# Patient Record
Sex: Female | Born: 1975 | Race: White | Hispanic: No | Smoking: Never smoker
Health system: Southern US, Community
[De-identification: ages and names within clinical notes are randomized; demographics above are authoritative.]

## PROBLEM LIST (undated history)

## (undated) DIAGNOSIS — I10 Essential (primary) hypertension: Secondary | ICD-10-CM

## (undated) DIAGNOSIS — D069 Carcinoma in situ of cervix, unspecified: Secondary | ICD-10-CM

## (undated) HISTORY — DX: Essential (primary) hypertension: I10

## (undated) HISTORY — DX: Carcinoma in situ of cervix, unspecified: D06.9

---

## 2001-08-02 ENCOUNTER — Other Ambulatory Visit: Admission: RE | Admit: 2001-08-02 | Discharge: 2001-08-02 | Payer: Self-pay | Admitting: Gynecology

## 2002-09-10 ENCOUNTER — Other Ambulatory Visit: Admission: RE | Admit: 2002-09-10 | Discharge: 2002-09-10 | Payer: Self-pay | Admitting: Gynecology

## 2003-10-03 ENCOUNTER — Other Ambulatory Visit: Admission: RE | Admit: 2003-10-03 | Discharge: 2003-10-03 | Payer: Self-pay | Admitting: Internal Medicine

## 2004-10-14 ENCOUNTER — Other Ambulatory Visit: Admission: RE | Admit: 2004-10-14 | Discharge: 2004-10-14 | Payer: Self-pay | Admitting: Gynecology

## 2005-05-03 ENCOUNTER — Inpatient Hospital Stay (HOSPITAL_COMMUNITY): Admission: AD | Admit: 2005-05-03 | Discharge: 2005-05-06 | Payer: Self-pay | Admitting: Gynecology

## 2005-06-15 ENCOUNTER — Encounter: Payer: Self-pay | Admitting: Family Medicine

## 2005-06-15 LAB — CONVERTED CEMR LAB

## 2005-06-16 ENCOUNTER — Other Ambulatory Visit: Admission: RE | Admit: 2005-06-16 | Discharge: 2005-06-16 | Payer: Self-pay | Admitting: Gynecology

## 2006-01-20 ENCOUNTER — Ambulatory Visit: Payer: Self-pay | Admitting: Family Medicine

## 2006-04-28 ENCOUNTER — Encounter: Payer: Self-pay | Admitting: Family Medicine

## 2006-05-18 ENCOUNTER — Ambulatory Visit: Payer: Self-pay | Admitting: Family Medicine

## 2006-05-18 DIAGNOSIS — G44209 Tension-type headache, unspecified, not intractable: Secondary | ICD-10-CM

## 2006-06-23 ENCOUNTER — Other Ambulatory Visit: Admission: RE | Admit: 2006-06-23 | Discharge: 2006-06-23 | Payer: Self-pay | Admitting: Gynecology

## 2007-04-11 ENCOUNTER — Ambulatory Visit: Payer: Self-pay | Admitting: Family Medicine

## 2007-06-02 ENCOUNTER — Ambulatory Visit: Payer: Self-pay | Admitting: Family Medicine

## 2007-06-02 DIAGNOSIS — J029 Acute pharyngitis, unspecified: Secondary | ICD-10-CM

## 2007-06-02 LAB — CONVERTED CEMR LAB: Rapid Strep: NEGATIVE

## 2007-06-03 ENCOUNTER — Encounter: Payer: Self-pay | Admitting: Family Medicine

## 2007-07-06 ENCOUNTER — Other Ambulatory Visit: Admission: RE | Admit: 2007-07-06 | Discharge: 2007-07-06 | Payer: Self-pay | Admitting: Gynecology

## 2007-09-08 ENCOUNTER — Ambulatory Visit: Payer: Self-pay | Admitting: Family Medicine

## 2007-09-08 DIAGNOSIS — M659 Synovitis and tenosynovitis, unspecified: Secondary | ICD-10-CM

## 2008-01-15 ENCOUNTER — Ambulatory Visit: Payer: Self-pay | Admitting: Family Medicine

## 2008-01-15 DIAGNOSIS — M545 Low back pain, unspecified: Secondary | ICD-10-CM | POA: Insufficient documentation

## 2008-01-22 ENCOUNTER — Encounter: Payer: Self-pay | Admitting: Family Medicine

## 2008-01-29 ENCOUNTER — Encounter: Payer: Self-pay | Admitting: Family Medicine

## 2008-07-04 ENCOUNTER — Encounter: Payer: Self-pay | Admitting: Family Medicine

## 2008-07-04 LAB — CONVERTED CEMR LAB
Cholesterol: 171 mg/dL
LDL Cholesterol: 62 mg/dL

## 2008-07-08 ENCOUNTER — Ambulatory Visit: Payer: Self-pay | Admitting: Women's Health

## 2008-07-08 ENCOUNTER — Encounter: Payer: Self-pay | Admitting: Women's Health

## 2008-07-08 ENCOUNTER — Other Ambulatory Visit: Admission: RE | Admit: 2008-07-08 | Discharge: 2008-07-08 | Payer: Self-pay | Admitting: Gynecology

## 2008-07-08 ENCOUNTER — Encounter: Payer: Self-pay | Admitting: Family Medicine

## 2008-07-08 LAB — CONVERTED CEMR LAB
HCT: 44.6 %
Platelets: 293 10*3/uL

## 2008-07-10 ENCOUNTER — Encounter: Payer: Self-pay | Admitting: Family Medicine

## 2008-07-10 DIAGNOSIS — E785 Hyperlipidemia, unspecified: Secondary | ICD-10-CM

## 2008-07-15 ENCOUNTER — Ambulatory Visit: Payer: Self-pay | Admitting: Family Medicine

## 2008-10-08 ENCOUNTER — Telehealth: Payer: Self-pay | Admitting: Family Medicine

## 2008-10-08 ENCOUNTER — Telehealth (INDEPENDENT_AMBULATORY_CARE_PROVIDER_SITE_OTHER): Payer: Self-pay | Admitting: *Deleted

## 2008-10-10 ENCOUNTER — Encounter: Payer: Self-pay | Admitting: Family Medicine

## 2008-10-11 LAB — CONVERTED CEMR LAB
ALT: 15 units/L (ref 0–35)
AST: 13 units/L (ref 0–37)
Albumin: 4.6 g/dL (ref 3.5–5.2)
Alkaline Phosphatase: 48 units/L (ref 39–117)
BUN: 12 mg/dL (ref 6–23)
CO2: 24 meq/L (ref 19–32)
Calcium: 9 mg/dL (ref 8.4–10.5)
Chloride: 106 meq/L (ref 96–112)
Cholesterol: 163 mg/dL (ref 0–200)
Creatinine, Ser: 0.6 mg/dL (ref 0.40–1.20)
Glucose, Bld: 76 mg/dL (ref 70–99)
HDL: 42 mg/dL (ref >39)
LDL Cholesterol: 93 mg/dL (ref 0–99)
Potassium: 3.9 meq/L (ref 3.5–5.3)
Sodium: 142 meq/L (ref 135–145)
Total Bilirubin: 1 mg/dL (ref 0.3–1.2)
Total CHOL/HDL Ratio: 3.9
Total Protein: 7.1 g/dL (ref 6.0–8.3)
Triglycerides: 139 mg/dL (ref <150)
VLDL: 28 mg/dL (ref 0–40)

## 2008-10-14 ENCOUNTER — Ambulatory Visit: Payer: Self-pay | Admitting: Family Medicine

## 2009-05-26 ENCOUNTER — Ambulatory Visit: Payer: Self-pay | Admitting: Family Medicine

## 2009-05-26 DIAGNOSIS — J020 Streptococcal pharyngitis: Secondary | ICD-10-CM

## 2009-05-26 LAB — CONVERTED CEMR LAB: Rapid Strep: POSITIVE

## 2009-06-14 DIAGNOSIS — D069 Carcinoma in situ of cervix, unspecified: Secondary | ICD-10-CM

## 2009-06-14 HISTORY — DX: Carcinoma in situ of cervix, unspecified: D06.9

## 2009-07-09 ENCOUNTER — Other Ambulatory Visit: Admission: RE | Admit: 2009-07-09 | Discharge: 2009-07-09 | Payer: Self-pay | Admitting: Gynecology

## 2009-07-09 ENCOUNTER — Ambulatory Visit: Payer: Self-pay | Admitting: Women's Health

## 2009-07-15 HISTORY — PX: CERVICAL BIOPSY  W/ LOOP ELECTRODE EXCISION: SUR135

## 2009-07-18 ENCOUNTER — Ambulatory Visit: Payer: Self-pay | Admitting: Gynecology

## 2009-07-25 ENCOUNTER — Ambulatory Visit: Payer: Self-pay | Admitting: Gynecology

## 2009-08-04 ENCOUNTER — Ambulatory Visit: Payer: Self-pay | Admitting: Gynecology

## 2009-08-04 ENCOUNTER — Ambulatory Visit (HOSPITAL_BASED_OUTPATIENT_CLINIC_OR_DEPARTMENT_OTHER): Admission: RE | Admit: 2009-08-04 | Discharge: 2009-08-04 | Payer: Self-pay | Admitting: Gynecology

## 2009-08-18 ENCOUNTER — Ambulatory Visit: Payer: Self-pay | Admitting: Gynecology

## 2009-08-22 ENCOUNTER — Ambulatory Visit: Payer: Self-pay | Admitting: Family Medicine

## 2009-08-22 DIAGNOSIS — R05 Cough: Secondary | ICD-10-CM

## 2009-08-22 DIAGNOSIS — J309 Allergic rhinitis, unspecified: Secondary | ICD-10-CM | POA: Insufficient documentation

## 2009-08-25 ENCOUNTER — Telehealth: Payer: Self-pay | Admitting: Family Medicine

## 2009-10-20 ENCOUNTER — Ambulatory Visit: Payer: Self-pay | Admitting: Family Medicine

## 2009-10-20 DIAGNOSIS — J0110 Acute frontal sinusitis, unspecified: Secondary | ICD-10-CM

## 2009-10-20 DIAGNOSIS — R599 Enlarged lymph nodes, unspecified: Secondary | ICD-10-CM | POA: Insufficient documentation

## 2009-12-22 ENCOUNTER — Other Ambulatory Visit: Admission: RE | Admit: 2009-12-22 | Discharge: 2009-12-22 | Payer: Self-pay | Admitting: Gynecology

## 2009-12-22 ENCOUNTER — Ambulatory Visit: Payer: Self-pay | Admitting: Gynecology

## 2010-02-27 ENCOUNTER — Telehealth (INDEPENDENT_AMBULATORY_CARE_PROVIDER_SITE_OTHER): Payer: Self-pay | Admitting: *Deleted

## 2010-07-09 ENCOUNTER — Other Ambulatory Visit (HOSPITAL_COMMUNITY)
Admission: RE | Admit: 2010-07-09 | Discharge: 2010-07-09 | Disposition: A | Discharge status: Home / Self Care | Payer: Managed Care, Other (non HMO) | Source: Ambulatory Visit | Attending: Gynecology | Admitting: Gynecology

## 2010-07-09 ENCOUNTER — Other Ambulatory Visit: Payer: Self-pay | Admitting: Women's Health

## 2010-07-09 ENCOUNTER — Ambulatory Visit
Admission: RE | Admit: 2010-07-09 | Discharge: 2010-07-09 | Payer: Self-pay | Source: Home / Self Care | Attending: Women's Health | Admitting: Women's Health

## 2010-07-09 DIAGNOSIS — Z124 Encounter for screening for malignant neoplasm of cervix: Secondary | ICD-10-CM | POA: Insufficient documentation

## 2010-07-12 ENCOUNTER — Encounter: Payer: Self-pay | Admitting: Family Medicine

## 2010-07-14 ENCOUNTER — Telehealth: Payer: Self-pay | Admitting: Family Medicine

## 2010-07-14 NOTE — Progress Notes (Signed)
Summary: FLU SHOT     Influenza Immunization History:    Influenza # 1:  Historical (02/26/2010)

## 2010-07-14 NOTE — Assessment & Plan Note (Signed)
Summary: allergies   Vital Signs:  Patient profile:   35 year old female Height:      65 inches Weight:      194 pounds BMI:     32.40 O2 Sat:      100 % on Room air Temp:     98.6 degrees F oral Pulse rate:   88 / minute BP sitting:   107 / 66  (left arm) Cuff size:   large  Vitals Entered By: Payton Spark CMA (August 22, 2009 1:14 PM)  O2 Flow:  Room air CC: Cough x 2 weeks. ? Allergies   Primary Care Provider:  Seymour Bars DO  CC:  Cough x 2 weeks. ? Allergies.  History of Present Illness: 35  yr WF presents with a dry cough x2 weeks.. Her cough is worsened by talking and cold air.  She also complains of sneezing, HA,and  itchy watery eyes.  No rhinorrhea. Denies sore throat or hoarsness.  No SOB or chest tightness.  She tried Claritin last week which helped with associated symptoms but did not improve her cough.   Denies any history of (seasonal) allergies or asthma.   No Known drug allergies.  She has one cat in her house. Non-smoker.   She had a LEEP procedure done in the GYN office recently.   Current Medications (verified): 1)  Fish Oil 1000 Mg Caps (Omega-3 Fatty Acids) .... By Mouth Daily 2)  Junel 1.5/30 1.5-30 Mg-Mcg Tabs (Norethindrone Acet-Ethinyl Est) .... Take 1 Tab By Mouth Once Daily  Allergies (verified): No Known Drug Allergies  Past History:  Past Medical History: Reviewed history from 10/14/2008 and no changes required. Gyn in [place], pap smear Jan 2010 --normal.    Past Surgical History: LTCS Leep 2011  Family History: Reviewed history from 05/18/2006 and no changes required. father died in an accident  mother healthy, only child  Social History: Reviewed history from 10/14/2008 and no changes required. 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  Married to [place].  Has a 61 yo son. and 2 stepchildren.  Nonsmoker.  2 ETOH drinks per wk.  On OCPs.  Does not exercise.  Review of Systems      See HPI  Physical Exam  General:  alert,  well-developed, well-nourished, and well-hydrated.   Head:  normocephalic.   Eyes:  no conjuctiva injection no watering.  no lid edema Nose:  mucosal erythema and mucosal edema.  Bilateral enlarged turbinates. Mouth:  good dentition, pharynx pink and moist, no erythema, and no postnasal drip.   Neck:  no masses.   Lungs:  Respirations even and unlabored bilaterally. Cough induced wheeze auscultated. No rales  or rhonci. nonlabored Heart:  Heart rate and rhythm regular. no murmur Pulses:  R radial normal and L radial normal.   Skin:  no rash Cervical Nodes:  no anterior cervical adenopathy and no posterior cervical adenopathy.   Psych:  good eye contact, not anxious appearing, and not depressed appearing.     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  Will change Claritin to Clarinex once daily for Spring allergies. T essalon as needed for dry cough. Call if not improved in the next 3 wks.  Her updated medication list for this problem includes:    Clarinex 5 Mg Tabs (Desloratadine) .Marland Kitchen... 1 tab by mouth once daily  Complete Medication List: 1)  Fish Oil 1000 Mg Caps (Omega-3 fatty acids) .... By  mouth daily 2)  Junel 1.5/30 1.5-30 Mg-mcg Tabs (Norethindrone acet-ethinyl est) .... Take 1 tab by mouth once daily 3)  Clarinex 5 Mg Tabs (Desloratadine) .Marland Kitchen.. 1 tab by mouth once daily 4)  Tessalon 200 Mg Caps (Benzonatate) .Marland Kitchen.. 1 capsule by mouth three times a day as needed cough  Other Orders: Peak Flow Rate (04540)  Patient Instructions: 1)  Start clarinex once daily for spring allergies. 2)  Use tessalon as needed for dry cough. 3)  Call if not improved in the next 3 wks. Prescriptions: CLARINEX 5 MG TABS (DESLORATADINE) 1 tab by mouth once daily  #30 x 0   Entered and Authorized by:   Seymour Bars DO   Signed by:   Seymour Bars DO on 08/22/2009   Method used:   Electronically to        [facility] [address]* (retail)       [address] [place], [place]  [address]       Ph: [phone]       Fax: [phone]   RxID:   [id number] TESSALON 200 MG CAPS (BENZONATATE) 1 capsule by mouth three times a day as needed cough  #30 x 0   Entered and Authorized by:   Seymour Bars DO   Signed by:   Seymour Bars DO on 08/22/2009   Method used:   Electronically to        [facility] [address] (retail)       [address] [place], [place]  [address]       Ph: [phone]       Fax: [phone]   RxID:   [id number] CLARINEX 5 MG TABS (DESLORATADINE) 1 tab by mouth once daily  #20 x 0   Entered and Authorized by:   Seymour Bars DO   Signed by:   Seymour Bars DO on 08/22/2009   Method used:   Electronically to        [facility] [address] (retail)       [address] [place], [place]  [address]       Ph: [phone]       Fax: [phone]   RxID:   [id number]

## 2010-07-14 NOTE — Progress Notes (Signed)
Summary: Allergies and SOB  Phone Note Call from Patient   Caller: Patient Summary of Call: Pt states she would like something better for allergies than the clarinex. She also states the cough med is not helping bc she really doesn't have a cough, she has SOB that causes a slight cough. What do you recommend? Please advise.  Initial call taken by: Payton Spark CMA,  August 25, 2009 8:59 AM  Follow-up for Phone Call        Thre is really not once that is better per se but we can change to allegra and add a nasal steroid spray if she would ike.  Follow-up by: Nani Gasser MD,  August 25, 2009 9:21 AM  Additional Follow-up for Phone Call Additional follow up Details #1::        Pt states she will use allegra OTC and would like nasal spray called in.  Additional Follow-up by: Payton Spark CMA,  August 25, 2009 9:58 AM    Additional Follow-up for Phone Call Additional follow up Details #2::    Nasal steroid sent.  Follow-up by: Nani Gasser MD,  August 25, 2009 10:22 AM  New/Updated Medications: FLUTICASONE PROPIONATE 50 MCG/ACT SUSP (FLUTICASONE PROPIONATE) 2 sprays in each nostril once a day. Prescriptions: FLUTICASONE PROPIONATE 50 MCG/ACT SUSP (FLUTICASONE PROPIONATE) 2 sprays in each nostril once a day.  #1 bottle x 1   Entered and Authorized by:   Nani Gasser MD   Signed by:   Nani Gasser MD on 08/25/2009   Method used:   Electronically to        [facility] ([facility]ail)       [address] [place], [place]  [address]       Ph: [phone]       Fax: [phone]   RxID:   [id number]

## 2010-07-14 NOTE — Assessment & Plan Note (Signed)
Summary: sinusitis   Vital Signs:  Patient profile:   35 year old female Height:      65 inches Weight:      198 pounds BMI:     33.07 O2 Sat:      97 % on Room air Temp:     98.6 degrees F oral Pulse rate:   107 / minute BP sitting:   126 / 70  (left arm) Cuff size:   large  Vitals Entered By: Payton Spark CMA (Oct 20, 2009 11:48 AM)  O2 Flow:  Room air CC: Swollen lymph nodes in neck x 4 days   Primary Care Provider:  Seymour Bars DO  CC:  Swollen lymph nodes in neck x 4 days.  History of Present Illness: 35 yo WF presents for for a painful swollen LN in her R anterior cervical chain and her submental region.   This started 3-4 days ago.   She has not a sore throat but she has seasonal allergies and had not been taking her Claritin.  She is more tired.   She is having sinus pressure and HAs across her forehead. Aleve or Advil not helping.  Teeth hurt from her braces.  No recent dental works.  No fevers.  No ear pain.    Current Medications (verified): 1)  Fish Oil 1000 Mg Caps (Omega-3 Fatty Acids) .... By Mouth Daily 2)  Junel 1.5/30 1.5-30 Mg-Mcg Tabs (Norethindrone Acet-Ethinyl Est) .... Take 1 Tab By Mouth Once Daily 3)  Claritin 10 Mg Tabs (Loratadine) .... Take 1 Tab By Mouth Once Daily 4)  Fluticasone Propionate 50 Mcg/act Susp (Fluticasone Propionate) .... 2 Sprays in Each Nostril Once A Day.  Allergies (verified): No Known Drug Allergies  Past History:  Past Surgical History: Reviewed history from 08/22/2009 and no changes required. LTCS Leep 2011  Social History: Reviewed history from 10/14/2008 and no changes required. 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  Has a 93 yo son. and 2 stepchildren.  Nonsmoker.  2 ETOH drinks per wk.  On OCPs.  Does not exercise.  Review of Systems      See HPI  Physical Exam  General:  alert, well-developed, well-nourished, and well-hydrated.   Head:  normocephalic and atraumatic.  frontal region  TTP Eyes:  wears glasses; conjunctiva clear, EOMI Nose:  boggy turbinates, nasal congestion Mouth:  o/p pink and moist.  No injection, exudates, tonsilar hypertrophy. wearing braces Neck:  supple.   Lungs:  Respirations even and unlabored bilaterally. Cough induced wheeze auscultated. No rales  or rhonci. nonlabored Heart:  no murmur and tachycardia.   Skin:  color normal.   Cervical Nodes:  shotty R>L anterior cervical chain and submental LA, mild.   Impression & Recommendations:  Problem # 1:  ACUTE FRONTAL SINUSITIS (ICD-461.1) Secondary to poorly managed seasonal allergies.  Treat with Cefdinir x 10 days + restart of Claritin + Flonase.  Use Advil as needed for aches and pains. The following medications were removed from the medication list:    Tessalon 200 Mg Caps (Benzonatate) .Marland Kitchen... 1 capsule by mouth three times a day as needed cough Her updated medication list for this problem includes:    Fluticasone Propionate 50 Mcg/act Susp (Fluticasone propionate) .Marland Kitchen... 2 sprays in each nostril once a day.    Cefdinir 300 Mg Caps (Cefdinir) .Marland Kitchen... 1 capsule by mouth q 12 hrs x 10 days  Problem # 2:  LYMPHADENOPATHY, REACTIVE (ICD-785.6) Anterior cervical and submental reactive lymphadenopathy  secondary to bacterial sinusitis.  Expect resolution in 10-14 days.  Call if not improving. Her updated medication list for this problem includes:    Cefdinir 300 Mg Caps (Cefdinir) .Marland Kitchen... 1 capsule by mouth q 12 hrs x 10 days  Complete Medication List: 1)  Fish Oil 1000 Mg Caps (Omega-3 fatty acids) .... By mouth daily 2)  Junel 1.5/30 1.5-30 Mg-mcg Tabs (Norethindrone acet-ethinyl est) .... Take 1 tab by mouth once daily 3)  Claritin 10 Mg Tabs (Loratadine) .... Take 1 tab by mouth once daily 4)  Fluticasone Propionate 50 Mcg/act Susp (Fluticasone propionate) .... 2 sprays in each nostril once a day. 5)  Cefdinir 300 Mg Caps (Cefdinir) .Marland Kitchen.. 1 capsule by mouth q 12 hrs x 10 days  Patient  Instructions: 1)  Take cefdinir x 10 days for sinus infection/ reactive lymphadenopathy. 2)  Use Advil as needed for aches and pains., 3)  Stay on Claritin + Flonase for allergies. 4)  Call if not improved in 10-14 days. Prescriptions: CEFDINIR 300 MG CAPS (CEFDINIR) 1 capsule by mouth q 12 hrs x 10 days  #20 x 0   Entered and Authorized by:   Seymour Bars DO   Signed by:   Seymour Bars DO on 10/20/2009   Method used:   Electronically to        [facility] (retail)       [address] [place], [place]  [address]       Ph: [phone]       Fax: [phone]   RxID:   [id number]

## 2010-07-22 NOTE — Progress Notes (Signed)
Summary: [facility]-Strep Throat  Phone Note Call from Patient Call back at Home Phone [phone]   Caller: Patient Reason for Call: Talk to Nurse Summary of Call: pt was seen at [facility] in [place] and was diagnosed with strep throat.  Pt was given PCN and told to do warm salt water gargles.  Pt states gargles really did not help her.  Pt has now developed a cough that is aggravating her throat and making it more painful.  Pt took delsym last night and got a little relief from cough.  Pt would like rx for lidocaine spray for throat to help with pain.  She states that she has tried OTC spray, but it was ineffective and made her throat hurt worse.  Please advise. Initial call taken by: Francee Piccolo CMA Duncan Dull),  July 14, 2010 9:24 AM  Follow-up for Phone Call        I can prescribe her Magic Mouthwash to gargle with.  If not feeling better by Thursday, please schedule OV to see me.  Thanks. Follow-up by: Seymour Bars DO,  July 14, 2010 9:41 AM    New/Updated Medications: * DUKE'S MAGIC MOUTHWASH 10 ml swish and gargle 4 x a day as needed for sore throat pain Prescriptions: DUKE'S MAGIC MOUTHWASH 10 ml swish and gargle 4 x a day as needed for sore throat pain  #200 ml x 0   Entered and Authorized by:   Seymour Bars DO   Signed by:   Seymour Bars DO on 07/14/2010   Method used:   Printed then faxed to ...       [facility] ([facility])       [address] [place], [place]  [address]       Ph: [phone]       Fax: [phone]   RxID:   [id number]   Appended Document: [facility]-Strep Throat pt notified of above and she will call if no better on Thursday.

## 2010-08-05 NOTE — Letter (Signed)
[facility]   Imported By: Kassie Mends 07/30/2010 08:56:06  _____________________________________________________________________  External Attachment:    Type:   Image     Comment:   External Document

## 2010-09-03 LAB — POCT HEMOGLOBIN-HEMACUE: Hemoglobin: 13.3 g/dL (ref 12.0–15.0)

## 2010-10-30 NOTE — Discharge Summary (Signed)
NAME:  Anne Vazquez, Anne Vazquez            ACCOUNT NO.:  [id number]   MEDICAL RECORD NO.:  [id number]          PATIENT TYPE:  INP   LOCATION:  9113                          FACILITY:  [facility]   PHYSICIAN:  Timothy P. Fontaine, M.D.DATE OF BIRTH:  01/17/1976   DATE OF ADMISSION:  05/03/2005  DATE OF DISCHARGE:  05/06/2005                                 DISCHARGE SUMMARY   DISCHARGE DIAGNOSES:  1.  Pregnancy at 39 weeks' gestation.  2.  Arrest of dilatation and descent.   PROCEDURE:  Primary low transverse cervical cesarean section May 04, 2005, by Dr. Ivor Costa. [place].   HOSPITAL COURSE:  A 35 year old, G1, P4 female at 48 weeks' gestation who  enters with spontaneous rupture of membranes.  She was admitted and begun on  Pitocin augmentation and progressed to 7 cm, 80%, -3 station.  The patient  was without change despite adequate labor and ultimately underwent a primary  cesarean section by Dr. Douglass Rivers.  The patient's postoperative course  was uncomplicated.  She was discharged on postop day #2, ambulating well,  tolerating a regular diet with a postoperative hemoglobin of 8.8.  The  patient desired discharge on postop day #2.  She understands that it is  early, is doing well and desires discharge.  She is Rh negative and RhoGAM  evaluation revealed that the infant was also Rh negative and therefore not a  RhoGAM candidate.  She received precautions, instructions and follow-up.   FOLLOW UP:  She will be seen in the office in 6 weeks.   DISCHARGE MEDICATIONS:  Received a prescription for Tylox #25, one to two  p.o. q.4-6h.  p.r.n. pain.   SPECIAL INSTRUCTIONS:  She is rubella titer immune.      Timothy P. Fontaine, M.D.  Electronically Signed     TPF/MEDQ  D:  05/06/2005  T:  05/06/2005  Job:  [id number]

## 2010-10-30 NOTE — H&P (Signed)
NAME:  Anne Vazquez, Anne Vazquez NO.:  [id number]   MEDICAL RECORD NO.:  [id number]            PATIENT TYPE:   LOCATION:                                 FACILITY:   PHYSICIAN:  Juan H. Lily Peer, M.D.     DATE OF BIRTH:   DATE OF ADMISSION:  DATE OF DISCHARGE:                                HISTORY & PHYSICAL   CHIEF COMPLAINT:  Spontaneous rupture of membranes at 1730 hours.   HISTORY OF PRESENT ILLNESS:  The patient is a 35 year old gravida 1, para 0,  last menstrual period August 01, 2004, estimated date of confinement  May 08, 2005, currently 39-2/7 weeks' gestation.  She was seen in the  office today.  She was found to be 1-2 cm, 80% effaced, -3 station, and  called late this afternoon at approximately 1730 hours.  She had a gush of  fluid when she presented to [facility].  She was noted to have  positive Nitrazine and fluid in her vaginal vault, questionable light  meconium, reassuring fetal heart rate tracing, infrequent contractions with  a uterine irritability.  Her temperature was  98.4 in the emergency room.  The patient's prenatal course essentially had been unremarkable with the  exception that she received RhoGAM at 28 weeks' gestation.   PAST MEDICAL HISTORY:  No past medical problems.   ALLERGIES:  The patient denies any allergies.   REVIEW OF SYSTEMS:  See [place] form.   PHYSICAL EXAMINATION:  VITAL SIGNS:  Temperature 98.4, pulse 98,  respirations 20, blood pressure 125/81.  HEENT:  Unremarkable.  NECK:  Supple.  Trachea midline.  No carotid bruits.  No thyromegaly.  LUNGS:  Clear to auscultation without rhonchi or wheezes.  HEART:  Regular rate and rhythm.  No murmurs or gallops.  BREASTS:  Exam not done.  ABDOMEN:  Gravid uterus, vertex presentation with Leopold maneuver.  PELVIC:  Gross rupture of membranes, light meconium.  Cervix was 1-2 cm, 80%  effaced, -3 station.  EXTREMITIES:  DTR 1+, negative clonus.   LABORATORY DATA:   Prenatal labs:  A negative blood type, negative antibody  screen.  VDRL was nonreactive.  Rubella immune.  Hepatitis B surface  antigen, HIV were negative.  GBS culture was negative.  The patient declined  cystic fibrosis screening.   ASSESSMENT:  A 35 year old gravida 1, para 0, 39-2/7 weeks' gestation with  spontaneous rupture of membranes at 1730 hours this afternoon.  Light  meconium, reassuring fetal heart rate tracing, infrequent contractions with  intermittent uterine irritability.  GBS culture was negative.  The patient  will be admitted to [facility].  Will begin to augment her with  Pitocin and if she is not delivered by 0530 after 12 hours of ruptured  membranes, will initiate penicillin G 5 million units IV followed by 2.5  million units IV q.4 h. for GBS prophylaxis.      Juan H. Lily Peer, M.D.  Electronically Signed     JHF/MEDQ  D:  05/03/2005  T:  05/03/2005  Job:  [id number]

## 2010-10-30 NOTE — Op Note (Signed)
NAME:  Anne Vazquez, Anne Vazquez            ACCOUNT NO.:  [id number]   MEDICAL RECORD NO.:  [id number]          PATIENT TYPE:  INP   LOCATION:  9113                          [facility]ILITY[facility]   PHYSICIAN:  Ivor Costa. Farrel Gobble, M.D. DATE OF BIRTH:  [birth year]   DATE OF PROCEDURE:  05/04/2005  DATE OF DISCHARGE:                                 OPERATIVE REPORT   PREOPERATIVE DIAGNOSIS:  Arrest of dilation and descent.   POSTOPERATIVE DIAGNOSIS:  1.  Arrest of dilation and descent.  2.  Macrosomia.   OPERATION/PROCEDURE:  Primary cesarean section.   SURGEON:  Ivor Costa. Farrel Gobble, M.D.   ANESTHESIA:  Epidural.   IV FLUIDS:  1500 mL lactated Ringer's.   ESTIMATED BLOOD LOSS:  500 mL.   URINARY OUTPUT:  200 mL concentrated urine.   FINDINGS:  A viable female in the vertex presentation.  Copious, thick,  terminal meconium, DeLee suction.  Apgars 9 and 9.  Birth weight 9 pounds 7  ounces.  Edematous changes to the uterus and bladder.  Normal tubes and  ovaries.   COMPLICATIONS:  None.   PATHOLOGY:  None.   DESCRIPTION OF PROCEDURE:  The patient was taken to the operating room,  placed in the supine position with left lateral displacement and prepped and  draped in the usual sterile fashion.  After adequate anesthesia was insured,  a Pfannenstiel skin incision was made with the scalpel and carried through  to the underlying layer of fascia with electrocautery.  The fascia was  scored in the midline.  Inferior aspect of the fascial incision was grasped  with Kochers. The underlying rectus muscle was dissected off with blunt and  sharp dissection.  In similar fashion the superior aspect of the incision  was grasped with Kochers and underlying rectus muscles were dissected off.  The rectus muscles were separated in the midline and peritoneum was  identified and entered bluntly.  The peritoneal incision was extended  superiorly and inferiorly with good visualization of the underlying bowel  and  bladder.  The orientation of the uterus was confirmed.  There was noted  to be marked amount of edema in the peritoneal tissues.  The vesicouterine  peritoneum was identified, tented up and entered sharply with the Metzenbaum  scissors and extended laterally.  Bladder flap was created digitally.  The  bladder blade was then reinserted in the lower uterine segment was incised  in the transverse fashion with the scalpel.  Thick meconium was noted upon  entering the cavity.  The incision was extended bluntly.  Infant was  delivered from the vertex presentation.  Prior to delivery of the head, the  suction was switched over to the DeLee.  The head was then delivered.  Nares  and mouth were then suctioned.  The infant was then delivered.  Cord was  clamped and cut and handed off the waiting pediatrician.  Cord bloods were  obtained.  Uterus was massaged.  Placenta was allowed to separate naturally.  The uterus was then cleared of all clots and debris.  The uterine incision  was repaired with running locked layer of  0 chromic and a second suture was  used for imbrication.  The pelvis was irrigated with copious amounts of warm  saline secondary to extensive amount thick meconium and gloves were changed  prior to proceeding with the uterine closure.  The adnexa were unremarkable.  The peritoneum was reapproximated with 3-0 plain.  The fascia was closed  with 2-0 Vicryl in a running fashion.  The subcutaneous tissue was  reapproximated with 3-0 plain.  The skin was closed with staples 4-0 Vicryl  on a Mellody Dance.  The patient tolerated the procedure well.  Sponge, lap and  needle counts were correct x2.  She was transferred to the PACU in stable  condition.  She received Ancef at cord clamp.      Ivor Costa. Farrel Gobble, M.D.  Electronically Signed     THL/MEDQ  D:  05/04/2005  T:  05/04/2005  Job:  [id number]

## 2010-12-14 ENCOUNTER — Ambulatory Visit: Payer: Managed Care, Other (non HMO) | Admitting: Women's Health

## 2010-12-24 ENCOUNTER — Other Ambulatory Visit (HOSPITAL_COMMUNITY)
Admission: RE | Admit: 2010-12-24 | Discharge: 2010-12-24 | Disposition: A | Discharge status: Home / Self Care | Payer: Managed Care, Other (non HMO) | Source: Ambulatory Visit | Attending: Gynecology | Admitting: Gynecology

## 2010-12-24 ENCOUNTER — Ambulatory Visit (INDEPENDENT_AMBULATORY_CARE_PROVIDER_SITE_OTHER): Payer: Managed Care, Other (non HMO) | Admitting: Women's Health

## 2010-12-24 ENCOUNTER — Other Ambulatory Visit: Payer: Self-pay | Admitting: Women's Health

## 2010-12-24 DIAGNOSIS — Z0142 Encounter for cervical smear to confirm findings of recent normal smear following initial abnormal smear: Secondary | ICD-10-CM

## 2010-12-24 DIAGNOSIS — R87619 Unspecified abnormal cytological findings in specimens from cervix uteri: Secondary | ICD-10-CM | POA: Insufficient documentation

## 2011-07-12 ENCOUNTER — Encounter: Payer: Managed Care, Other (non HMO) | Admitting: Women's Health

## 2011-07-16 ENCOUNTER — Encounter: Payer: Self-pay | Admitting: Women's Health

## 2011-07-16 ENCOUNTER — Other Ambulatory Visit (HOSPITAL_COMMUNITY)
Admission: RE | Admit: 2011-07-16 | Discharge: 2011-07-16 | Disposition: A | Discharge status: Home / Self Care | Payer: Managed Care, Other (non HMO) | Source: Ambulatory Visit | Attending: Gynecology | Admitting: Gynecology

## 2011-07-16 ENCOUNTER — Other Ambulatory Visit: Payer: Self-pay | Admitting: Women's Health

## 2011-07-16 ENCOUNTER — Ambulatory Visit (INDEPENDENT_AMBULATORY_CARE_PROVIDER_SITE_OTHER): Payer: Managed Care, Other (non HMO) | Admitting: Women's Health

## 2011-07-16 VITALS — BP 122/80 | Ht 64.5 in | Wt 198.0 lb

## 2011-07-16 DIAGNOSIS — R5381 Other malaise: Secondary | ICD-10-CM

## 2011-07-16 DIAGNOSIS — R5383 Other fatigue: Secondary | ICD-10-CM

## 2011-07-16 DIAGNOSIS — Z13220 Encounter for screening for lipoid disorders: Secondary | ICD-10-CM

## 2011-07-16 DIAGNOSIS — Z833 Family history of diabetes mellitus: Secondary | ICD-10-CM

## 2011-07-16 DIAGNOSIS — Z01419 Encounter for gynecological examination (general) (routine) without abnormal findings: Secondary | ICD-10-CM

## 2011-07-16 LAB — CBC WITH DIFFERENTIAL/PLATELET
Basophils Relative: 0 % (ref 0–1)
Eosinophils Absolute: 0.1 10*3/uL (ref 0.0–0.7)
Lymphs Abs: 2.2 10*3/uL (ref 0.7–4.0)
MCH: 28.7 pg (ref 26.0–34.0)
MCHC: 33.5 g/dL (ref 30.0–36.0)
Neutrophils Relative %: 66 % (ref 43–77)
Platelets: 255 10*3/uL (ref 150–400)
RBC: 4.77 MIL/uL (ref 3.87–5.11)

## 2011-07-16 LAB — URINALYSIS W MICROSCOPIC + REFLEX CULTURE
Casts: NONE SEEN
Glucose, UA: NEGATIVE mg/dL
Specific Gravity, Urine: 1.02 (ref 1.005–1.030)
pH: 5.5 (ref 5.0–8.0)

## 2011-07-16 MED ORDER — NORETHINDRONE ACET-ETHINYL EST 1-20 MG-MCG PO TABS: 1.0000 | ORAL_TABLET | Freq: Every day | ORAL | Status: DC

## 2011-07-16 NOTE — Progress Notes (Signed)
[facility] [birth year] [id number]    History:    The patient presents for annual exam.  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 Normal Paps after. Loestrin 1/20 continuously without cycling without problem. Not sexually active.   Past medical history, past surgical history, family history and social history were all reviewed and documented in the EPIC chart. Mother diagnosed with type 2 diabetes this past year.   ROS:  A  ROS was performed and pertinent positives and negatives are included in the history.  Exam:  Filed Vitals:   07/16/11 0805  BP: 122/80    General appearance:  Normal Head/Neck:  Normal, without cervical or supraclavicular adenopathy. Thyroid:  Symmetrical, normal in size, without palpable masses or nodularity. Respiratory  Effort:  Normal  Auscultation:  Clear without wheezing or rhonchi Cardiovascular  Auscultation:  Regular rate, without rubs, murmurs or gallops  Edema/varicosities:  Not grossly evident Abdominal  Soft,nontender, without masses, guarding or rebound.  Liver/spleen:  No organomegaly noted  Hernia:  None appreciated  Skin  Inspection:  Grossly normal  Palpation:  Grossly normal Neurologic/psychiatric  Orientation:  Normal with appropriate conversation.  Mood/affect:  Normal  Genitourinary    Breasts: Examined lying and sitting.     Right: Without masses, retractions, discharge or axillary adenopathy.     Left: Without masses, retractions, discharge or axillary adenopathy.   Inguinal/mons:  Normal without inguinal adenopathy  External genitalia:  Normal  BUS/Urethra/Skene's glands:  Normal  Bladder:  Normal  Vagina:  Normal  Cervix:  Friable   Uterus:   normal in size, shape and contour.  Midline and mobile  Adnexa/parametria:     Rt: Without masses or tenderness.   Lt: Without masses or tenderness.  Anus and perineum: Normal  Digital rectal exam: Normal sphincter tone without palpated masses or  tenderness  Assessment/Plan:  36 y.o. DWF G1 P1 for annual exam with complaint of fatigue.  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 Reviewed slight risk for blood clots and strokes with birth control pills. Condoms encouraged if become sexually active. SBE's, annual mammogram at 38, baseline prior. Encouraged to increase exercise, decrease calories for weight loss, MVI daily encouraged. CBC, glucose, lipid profile, TSH, UA and Pap. Reviewed if Pap is normal can return to annual screening.  Harrington Challenger [facility], 9:38 AM 07/16/2011

## 2011-07-17 LAB — TSH: TSH: 3.401 u[IU]/mL (ref 0.350–4.500)

## 2011-09-21 ENCOUNTER — Other Ambulatory Visit: Payer: Self-pay

## 2011-09-21 MED ORDER — NORETHINDRONE ACET-ETHINYL EST 1-20 MG-MCG PO TABS: 1.0000 | ORAL_TABLET | Freq: Every day | ORAL | Status: DC

## 2012-07-18 ENCOUNTER — Other Ambulatory Visit: Payer: Self-pay | Admitting: *Deleted

## 2012-07-18 MED ORDER — NORETHINDRONE ACET-ETHINYL EST 1-20 MG-MCG PO TABS: 1.0000 | ORAL_TABLET | Freq: Every day | ORAL | Status: DC

## 2012-07-19 ENCOUNTER — Encounter: Payer: Self-pay | Admitting: Women's Health

## 2012-07-19 ENCOUNTER — Ambulatory Visit (INDEPENDENT_AMBULATORY_CARE_PROVIDER_SITE_OTHER): Payer: Managed Care, Other (non HMO) | Admitting: Women's Health

## 2012-07-19 ENCOUNTER — Other Ambulatory Visit (HOSPITAL_COMMUNITY)
Admission: RE | Admit: 2012-07-19 | Discharge: 2012-07-19 | Disposition: A | Discharge status: Home / Self Care | Payer: Managed Care, Other (non HMO) | Source: Ambulatory Visit | Attending: Women's Health | Admitting: Women's Health

## 2012-07-19 VITALS — BP 124/70 | Ht 64.5 in | Wt 206.0 lb

## 2012-07-19 DIAGNOSIS — Z309 Encounter for contraceptive management, unspecified: Secondary | ICD-10-CM

## 2012-07-19 DIAGNOSIS — Z01419 Encounter for gynecological examination (general) (routine) without abnormal findings: Secondary | ICD-10-CM | POA: Insufficient documentation

## 2012-07-19 DIAGNOSIS — IMO0001 Reserved for inherently not codable concepts without codable children: Secondary | ICD-10-CM

## 2012-07-19 DIAGNOSIS — Z833 Family history of diabetes mellitus: Secondary | ICD-10-CM

## 2012-07-19 DIAGNOSIS — Z1151 Encounter for screening for human papillomavirus (HPV): Secondary | ICD-10-CM | POA: Insufficient documentation

## 2012-07-19 MED ORDER — NORETHINDRONE ACET-ETHINYL EST 1-20 MG-MCG PO TABS: ORAL_TABLET | ORAL | Status: DC

## 2012-07-19 NOTE — Progress Notes (Signed)
Anne Vazquez 18-Oct-1975 [id number]    History:    The patient presents for annual exam.  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 Normal Paps following. Not sexually active.   Past medical history, past surgical history, family history and social history were all reviewed and documented in the EPIC chart. Mother diagnosed with diabetes. Anne Vazquez 5 doing well. Works as Associate Professor at [employer].   ROS:  A  ROS was performed and pertinent positives and negatives are included in the history.  Exam:  Filed Vitals:   07/19/12 1429  BP: 124/70    General appearance:  Normal Head/Neck:  Normal, without cervical or supraclavicular adenopathy. Thyroid:  Symmetrical, normal in size, without palpable masses or nodularity. Respiratory  Effort:  Normal  Auscultation:  Clear without wheezing or rhonchi Cardiovascular  Auscultation:  Regular rate, without rubs, murmurs or gallops  Edema/varicosities:  Not grossly evident Abdominal  Soft,nontender, without masses, guarding or rebound.  Liver/spleen:  No organomegaly noted  Hernia:  None appreciated  Skin  Inspection:  Grossly normal  Palpation:  Grossly normal Neurologic/psychiatric  Orientation:  Normal with appropriate conversation.  Mood/affect:  Normal  Genitourinary    Breasts: Examined lying and sitting.     Right: Without masses, retractions, discharge or axillary adenopathy.     Left: Without masses, retractions, discharge or axillary adenopathy.   Inguinal/mons:  Normal without inguinal adenopathy  External genitalia:  Normal  BUS/Urethra/Skene's glands:  Normal  Bladder:  Normal  Vagina:  Normal  Cervix:  Normal status post LEEP, friable  Uterus:   normal in size, shape and contour.  Midline and mobile  Adnexa/parametria:     Rt: Without masses or tenderness.   Lt: Without masses or tenderness.  Anus and perineum: Normal  Digital rectal exam: Normal sphincter tone without  palpated masses or tenderness  Assessment/Plan:  37 y.o. D. WF G1 P1 for annual exam with no complaints.     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 Condoms encouraged if become sexually active. SBE's, increase regular exercise and decrease calories for weight loss, Weight Watchers encouraged. Calcium rich diet encouraged, continue fish oil supplement, has had elevated triglycerides in the past will come fasting for lipid panel next year. CBC, glucose, UA, Pap. If Pap normal will repeat in 3 years, new screening guidelines reviewed.    Harrington Challenger WHNP, 3:21 PM 07/19/2012

## 2012-07-19 NOTE — Addendum Note (Signed)
Addended by: Richardson Chiquito on: 07/19/2012 03:35 PM   Modules accepted: Orders

## 2012-07-19 NOTE — Patient Instructions (Signed)

## 2012-07-20 ENCOUNTER — Encounter: Payer: Self-pay | Admitting: Women's Health

## 2012-07-20 LAB — URINALYSIS W MICROSCOPIC + REFLEX CULTURE
Casts: NONE SEEN
Crystals: NONE SEEN
Ketones, ur: NEGATIVE mg/dL
Leukocytes, UA: NEGATIVE
Nitrite: NEGATIVE
Specific Gravity, Urine: 1.02 (ref 1.005–1.030)
Squamous Epithelial / LPF: NONE SEEN
pH: 5.5 (ref 5.0–8.0)

## 2012-07-20 LAB — CBC WITH DIFFERENTIAL/PLATELET
Eosinophils Absolute: 0.1 10*3/uL (ref 0.0–0.7)
Eosinophils Relative: 1 % (ref 0–5)
Hemoglobin: 13.7 g/dL (ref 12.0–15.0)
Lymphocytes Relative: 45 % (ref 12–46)
Lymphs Abs: 3.6 10*3/uL (ref 0.7–4.0)
MCH: 28.6 pg (ref 26.0–34.0)
MCV: 84.1 fL (ref 78.0–100.0)
Monocytes Relative: 6 % (ref 3–12)
Neutrophils Relative %: 47 % (ref 43–77)
RBC: 4.79 MIL/uL (ref 3.87–5.11)

## 2012-07-20 LAB — GLUCOSE, RANDOM: Glucose, Bld: 77 mg/dL (ref 70–99)

## 2013-08-09 ENCOUNTER — Other Ambulatory Visit: Payer: Self-pay | Admitting: Women's Health

## 2013-08-10 ENCOUNTER — Other Ambulatory Visit: Payer: Self-pay

## 2013-08-10 MED ORDER — NORETHINDRONE ACET-ETHINYL EST 1-20 MG-MCG PO TABS: ORAL_TABLET | ORAL | Status: DC

## 2013-08-28 ENCOUNTER — Ambulatory Visit (INDEPENDENT_AMBULATORY_CARE_PROVIDER_SITE_OTHER): Payer: Managed Care, Other (non HMO) | Admitting: Women's Health

## 2013-08-28 ENCOUNTER — Other Ambulatory Visit (HOSPITAL_COMMUNITY)
Admission: RE | Admit: 2013-08-28 | Discharge: 2013-08-28 | Disposition: A | Discharge status: Home / Self Care | Payer: Managed Care, Other (non HMO) | Source: Ambulatory Visit | Attending: Women's Health | Admitting: Women's Health

## 2013-08-28 ENCOUNTER — Encounter: Payer: Self-pay | Admitting: Women's Health

## 2013-08-28 VITALS — BP 122/72 | Ht 65.0 in | Wt 212.8 lb

## 2013-08-28 DIAGNOSIS — Z833 Family history of diabetes mellitus: Secondary | ICD-10-CM

## 2013-08-28 DIAGNOSIS — Z1151 Encounter for screening for human papillomavirus (HPV): Secondary | ICD-10-CM | POA: Insufficient documentation

## 2013-08-28 DIAGNOSIS — Z13220 Encounter for screening for lipoid disorders: Secondary | ICD-10-CM

## 2013-08-28 DIAGNOSIS — H8110 Benign paroxysmal vertigo, unspecified ear: Secondary | ICD-10-CM

## 2013-08-28 DIAGNOSIS — IMO0001 Reserved for inherently not codable concepts without codable children: Secondary | ICD-10-CM

## 2013-08-28 DIAGNOSIS — Z01419 Encounter for gynecological examination (general) (routine) without abnormal findings: Secondary | ICD-10-CM | POA: Insufficient documentation

## 2013-08-28 DIAGNOSIS — Z309 Encounter for contraceptive management, unspecified: Secondary | ICD-10-CM

## 2013-08-28 LAB — CBC WITH DIFFERENTIAL/PLATELET
Basophils Absolute: 0 10*3/uL (ref 0.0–0.1)
Basophils Relative: 0 % (ref 0–1)
Eosinophils Absolute: 0.1 10*3/uL (ref 0.0–0.7)
Eosinophils Relative: 2 % (ref 0–5)
HCT: 41 % (ref 36.0–46.0)
Hemoglobin: 14.1 g/dL (ref 12.0–15.0)
LYMPHS ABS: 2.5 10*3/uL (ref 0.7–4.0)
LYMPHS PCT: 36 % (ref 12–46)
MCH: 28.4 pg (ref 26.0–34.0)
MCHC: 34.4 g/dL (ref 30.0–36.0)
MCV: 82.5 fL (ref 78.0–100.0)
Monocytes Absolute: 0.4 10*3/uL (ref 0.1–1.0)
Monocytes Relative: 6 % (ref 3–12)
NEUTROS ABS: 3.9 10*3/uL (ref 1.7–7.7)
NEUTROS PCT: 56 % (ref 43–77)
PLATELETS: 290 10*3/uL (ref 150–400)
RBC: 4.97 MIL/uL (ref 3.87–5.11)
RDW: 14.1 % (ref 11.5–15.5)
WBC: 6.9 10*3/uL (ref 4.0–10.5)

## 2013-08-28 LAB — LIPID PANEL
CHOLESTEROL: 166 mg/dL (ref 0–200)
HDL: 47 mg/dL (ref >39)
LDL Cholesterol: 75 mg/dL (ref 0–99)
TRIGLYCERIDES: 220 mg/dL — AB (ref <150)
Total CHOL/HDL Ratio: 3.5 Ratio
VLDL: 44 mg/dL — ABNORMAL HIGH (ref 0–40)

## 2013-08-28 LAB — GLUCOSE, RANDOM: GLUCOSE: 85 mg/dL (ref 70–99)

## 2013-08-28 MED ORDER — NORETHINDRONE ACET-ETHINYL EST 1-20 MG-MCG PO TABS: ORAL_TABLET | ORAL | Status: DC

## 2013-08-28 MED ORDER — SCOPOLAMINE 1 MG/3DAYS TD PT72: 1.0000 | MEDICATED_PATCH | TRANSDERMAL | Status: DC

## 2013-08-28 NOTE — Patient Instructions (Signed)

## 2013-08-28 NOTE — Progress Notes (Signed)
[facility] 1976-06-04 [id number]    History:    Presents for annual exam.  Takes Loestrin 1/20 continuously without complaint. Not sexually active in years. 2011 LEEP for CIN-2 and 3 with normal Paps after.  Past medical history, past surgical history, family history and social history were all reviewed and documented in the EPIC chart. [employer]. Mother diabetes on metformin, father died in a work-related injury. Going on a cruise for her 38th birthday. Anne Vazquez 6, struggling with school/reading.  ROS:  A  ROS was performed and pertinent positives and negatives are included.  Exam:  Filed Vitals:   08/28/13 1055  BP: 122/72    General appearance:  Normal Thyroid:  Symmetrical, normal in size, without palpable masses or nodularity. Respiratory  Auscultation:  Clear without wheezing or rhonchi Cardiovascular  Auscultation:  Regular rate, without rubs, murmurs or gallops  Edema/varicosities:  Not grossly evident Abdominal  Soft,nontender, without masses, guarding or rebound.  Liver/spleen:  No organomegaly noted  Hernia:  None appreciated  Skin  Inspection:  Grossly normal   Breasts: Examined lying and sitting.     Right: Without masses, retractions, discharge or axillary adenopathy.     Left: Without masses, retractions, discharge or axillary adenopathy. Gentitourinary   Inguinal/mons:  Normal without inguinal adenopathy  External genitalia:  Normal  BUS/Urethra/Skene's glands:  Normal  Vagina:  Normal  Cervix:  Normal  Uterus:  normal in size, shape and contour.  Midline and mobile  Adnexa/parametria:     Rt: Without masses or tenderness.   Lt: Without masses or tenderness.  Anus and perineum: Normal  Digital rectal exam: Normal sphincter tone without palpated masses or tenderness  Assessment/Plan:  38 y.o. DWF G1P1 for annual exam with no complaints.  2011 LEEP for CIN-2, 3 normal Paps after Loestrin continuously Obesity  Plan: Loestrin 1/20  prescription, proper use, slight risk for blood clots and strokes reviewed, condoms encouraged if sexually active. SBE's, annual mammogram at 38, increase regular exercise and decrease calories for weight loss, Weight Watchers reviewed. Calcium rich diet, vitamin D 1000 daily encouraged. CBC, glucose, lipid panel, UA, Pap with HR HPV typing.    Harrington ChallengerYOUNG,Fontella Shan J WHNP, 1:52 PM 08/28/2013

## 2013-08-29 LAB — URINALYSIS W MICROSCOPIC + REFLEX CULTURE
BILIRUBIN URINE: NEGATIVE
Bacteria, UA: NONE SEEN
CASTS: NONE SEEN
Crystals: NONE SEEN
GLUCOSE, UA: NEGATIVE mg/dL
HGB URINE DIPSTICK: NEGATIVE
KETONES UR: NEGATIVE mg/dL
LEUKOCYTES UA: NEGATIVE
Nitrite: NEGATIVE
PH: 5.5 (ref 5.0–8.0)
Protein, ur: NEGATIVE mg/dL
Specific Gravity, Urine: 1.019 (ref 1.005–1.030)
Urobilinogen, UA: 0.2 mg/dL (ref 0.0–1.0)

## 2014-01-09 ENCOUNTER — Encounter: Payer: Self-pay | Admitting: Women's Health

## 2014-04-15 ENCOUNTER — Encounter: Payer: Self-pay | Admitting: Women's Health

## 2014-06-18 ENCOUNTER — Other Ambulatory Visit: Payer: Self-pay | Admitting: Women's Health

## 2014-07-08 ENCOUNTER — Other Ambulatory Visit: Payer: Self-pay | Admitting: Women's Health

## 2014-08-22 ENCOUNTER — Other Ambulatory Visit: Payer: Self-pay | Admitting: Women's Health

## 2014-08-30 ENCOUNTER — Ambulatory Visit (INDEPENDENT_AMBULATORY_CARE_PROVIDER_SITE_OTHER): Payer: Managed Care, Other (non HMO) | Admitting: Women's Health

## 2014-08-30 ENCOUNTER — Encounter: Payer: Self-pay | Admitting: Women's Health

## 2014-08-30 ENCOUNTER — Other Ambulatory Visit (HOSPITAL_COMMUNITY)
Admission: RE | Admit: 2014-08-30 | Discharge: 2014-08-30 | Disposition: A | Discharge status: Home / Self Care | Payer: Managed Care, Other (non HMO) | Source: Ambulatory Visit | Attending: Gynecology | Admitting: Gynecology

## 2014-08-30 VITALS — BP 138/80 | Ht 65.0 in | Wt 216.0 lb

## 2014-08-30 DIAGNOSIS — Z833 Family history of diabetes mellitus: Secondary | ICD-10-CM | POA: Diagnosis not present

## 2014-08-30 DIAGNOSIS — Z01419 Encounter for gynecological examination (general) (routine) without abnormal findings: Secondary | ICD-10-CM | POA: Diagnosis not present

## 2014-08-30 DIAGNOSIS — Z3041 Encounter for surveillance of contraceptive pills: Secondary | ICD-10-CM

## 2014-08-30 DIAGNOSIS — Z13220 Encounter for screening for lipoid disorders: Secondary | ICD-10-CM

## 2014-08-30 LAB — LIPID PANEL
CHOLESTEROL: 153 mg/dL (ref 0–200)
HDL: 38 mg/dL — ABNORMAL LOW (ref >=46)
LDL CALC: 57 mg/dL (ref 0–99)
TRIGLYCERIDES: 290 mg/dL — AB (ref <150)
Total CHOL/HDL Ratio: 4 Ratio
VLDL: 58 mg/dL — ABNORMAL HIGH (ref 0–40)

## 2014-08-30 LAB — CBC WITH DIFFERENTIAL/PLATELET
Basophils Absolute: 0 10*3/uL (ref 0.0–0.1)
Basophils Relative: 0 % (ref 0–1)
EOS ABS: 0.1 10*3/uL (ref 0.0–0.7)
Eosinophils Relative: 2 % (ref 0–5)
HEMATOCRIT: 39.2 % (ref 36.0–46.0)
HEMOGLOBIN: 13.4 g/dL (ref 12.0–15.0)
LYMPHS ABS: 2.2 10*3/uL (ref 0.7–4.0)
LYMPHS PCT: 33 % (ref 12–46)
MCH: 28.4 pg (ref 26.0–34.0)
MCHC: 34.2 g/dL (ref 30.0–36.0)
MCV: 83.1 fL (ref 78.0–100.0)
MONOS PCT: 5 % (ref 3–12)
MPV: 9.2 fL (ref 8.6–12.4)
Monocytes Absolute: 0.3 10*3/uL (ref 0.1–1.0)
NEUTROS ABS: 4.1 10*3/uL (ref 1.7–7.7)
Neutrophils Relative %: 60 % (ref 43–77)
PLATELETS: 238 10*3/uL (ref 150–400)
RBC: 4.72 MIL/uL (ref 3.87–5.11)
RDW: 14 % (ref 11.5–15.5)
WBC: 6.8 10*3/uL (ref 4.0–10.5)

## 2014-08-30 LAB — HEMOGLOBIN A1C
HEMOGLOBIN A1C: 5.5 % (ref <5.7)
MEAN PLASMA GLUCOSE: 111 mg/dL (ref <117)

## 2014-08-30 MED ORDER — NORETHINDRONE ACET-ETHINYL EST 1-20 MG-MCG PO TABS: ORAL_TABLET | ORAL | Status: DC

## 2014-08-30 NOTE — Patient Instructions (Signed)
Health Maintenance Adopting a healthy lifestyle and getting preventive care can go a long way to promote health and wellness. Talk with your health care provider about what schedule of regular examinations is right for you. This is a good chance for you to check in with your provider about disease prevention and staying healthy. In between checkups, there are plenty of things you can do on your own. Experts have done a lot of research about which lifestyle changes and preventive measures are most likely to keep you healthy. Ask your health care provider for more information. WEIGHT AND DIET  Eat a healthy diet  Be sure to include plenty of vegetables, fruits, low-fat dairy products, and lean protein.  Do not eat a lot of foods high in solid fats, added sugars, or salt.  Get regular exercise. This is one of the most important things you can do for your health.  Most adults should exercise for at least 150 minutes each week. The exercise should increase your heart rate and make you sweat (moderate-intensity exercise).  Most adults should also do strengthening exercises at least twice a week. This is in addition to the moderate-intensity exercise.  Maintain a healthy weight  Body mass index (BMI) is a measurement that can be used to identify possible weight problems. It estimates body fat based on height and weight. Your health care provider can help determine your BMI and help you achieve or maintain a healthy weight.  For females 25 years of age and older:   A BMI below 18.5 is considered underweight.  A BMI of 18.5 to 24.9 is normal.  A BMI of 25 to 29.9 is considered overweight.  A BMI of 30 and above is considered obese.  Watch levels of cholesterol and blood lipids  You should start having your blood tested for lipids and cholesterol at 41 years of age, then have this test every 5 years.  You may need to have your cholesterol levels checked more often if:  Your lipid or  cholesterol levels are high.  You are older than 41 years of age.  You are at high risk for heart disease.  CANCER SCREENING   Lung Cancer  Lung cancer screening is recommended for adults 97-92 years old who are at high risk for lung cancer because of a history of smoking.  A yearly low-dose CT scan of the lungs is recommended for people who:  Currently smoke.  Have quit within the past 15 years.  Have at least a 30-pack-year history of smoking. A pack year is smoking an average of one pack of cigarettes a day for 1 year.  Yearly screening should continue until it has been 15 years since you quit.  Yearly screening should stop if you develop a health problem that would prevent you from having lung cancer treatment.  Breast Cancer  Practice breast self-awareness. This means understanding how your breasts normally appear and feel.  It also means doing regular breast self-exams. Let your health care provider know about any changes, no matter how small.  If you are in your 20s or 30s, you should have a clinical breast exam (CBE) by a health care provider every 1-3 years as part of a regular health exam.  If you are 76 or older, have a CBE every year. Also consider having a breast X-ray (mammogram) every year.  If you have a family history of breast cancer, talk to your health care provider about genetic screening.  If you are  at high risk for breast cancer, talk to your health care provider about having an MRI and a mammogram every year.  Breast cancer gene (BRCA) assessment is recommended for women who have family members with BRCA-related cancers. BRCA-related cancers include:  Breast.  Ovarian.  Tubal.  Peritoneal cancers.  Results of the assessment will determine the need for genetic counseling and BRCA1 and BRCA2 testing. Cervical Cancer Routine pelvic examinations to screen for cervical cancer are no longer recommended for nonpregnant women who are considered low  risk for cancer of the pelvic organs (ovaries, uterus, and vagina) and who do not have symptoms. A pelvic examination may be necessary if you have symptoms including those associated with pelvic infections. Ask your health care provider if a screening pelvic exam is right for you.   The Pap test is the screening test for cervical cancer for women who are considered at risk.  If you had a hysterectomy for a problem that was not cancer or a condition that could lead to cancer, then you no longer need Pap tests.  If you are older than 65 years, and you have had normal Pap tests for the past 10 years, you no longer need to have Pap tests.  If you have had past treatment for cervical cancer or a condition that could lead to cancer, you need Pap tests and screening for cancer for at least 20 years after your treatment.  If you no longer get a Pap test, assess your risk factors if they change (such as having a new sexual partner). This can affect whether you should start being screened again.  Some women have medical problems that increase their chance of getting cervical cancer. If this is the case for you, your health care provider may recommend more frequent screening and Pap tests.  The human papillomavirus (HPV) test is another test that may be used for cervical cancer screening. The HPV test looks for the virus that can cause cell changes in the cervix. The cells collected during the Pap test can be tested for HPV.  The HPV test can be used to screen women 30 years of age and older. Getting tested for HPV can extend the interval between normal Pap tests from three to five years.  An HPV test also should be used to screen women of any age who have unclear Pap test results.  After 41 years of age, women should have HPV testing as often as Pap tests.  Colorectal Cancer  This type of cancer can be detected and often prevented.  Routine colorectal cancer screening usually begins at 41 years of  age and continues through 41 years of age.  Your health care provider may recommend screening at an earlier age if you have risk factors for colon cancer.  Your health care provider may also recommend using home test kits to check for hidden blood in the stool.  A small camera at the end of a tube can be used to examine your colon directly (sigmoidoscopy or colonoscopy). This is done to check for the earliest forms of colorectal cancer.  Routine screening usually begins at age 50.  Direct examination of the colon should be repeated every 5-10 years through 41 years of age. However, you may need to be screened more often if early forms of precancerous polyps or small growths are found. Skin Cancer  Check your skin from head to toe regularly.  Tell your health care provider about any new moles or changes in   moles, especially if there is a change in a mole's shape or color.  Also tell your health care provider if you have a mole that is larger than the size of a pencil eraser.  Always use sunscreen. Apply sunscreen liberally and repeatedly throughout the day.  Protect yourself by wearing long sleeves, pants, a wide-brimmed hat, and sunglasses whenever you are outside. HEART DISEASE, DIABETES, AND HIGH BLOOD PRESSURE   Have your blood pressure checked at least every 1-2 years. High blood pressure causes heart disease and increases the risk of stroke.  If you are between 75 years and 42 years old, ask your health care provider if you should take aspirin to prevent strokes.  Have regular diabetes screenings. This involves taking a blood sample to check your fasting blood sugar level.  If you are at a normal weight and have a low risk for diabetes, have this test once every three years after 41 years of age.  If you are overweight and have a high risk for diabetes, consider being tested at a younger age or more often. PREVENTING INFECTION  Hepatitis B  If you have a higher risk for  hepatitis B, you should be screened for this virus. You are considered at high risk for hepatitis B if:  You were born in a country where hepatitis B is common. Ask your health care provider which countries are considered high risk.  Your parents were born in a high-risk country, and you have not been immunized against hepatitis B (hepatitis B vaccine).  You have HIV or AIDS.  You use needles to inject street drugs.  You live with someone who has hepatitis B.  You have had sex with someone who has hepatitis B.  You get hemodialysis treatment.  You take certain medicines for conditions, including cancer, organ transplantation, and autoimmune conditions. Hepatitis C  Blood testing is recommended for:  Everyone born from [birth year] through 1967.  Anyone with known risk factors for hepatitis C. Sexually transmitted infections (STIs)  You should be screened for sexually transmitted infections (STIs) including gonorrhea and chlamydia if:  You are sexually active and are younger than 41 years of age.  You are older than 41 years of age and your health care provider tells you that you are at risk for this type of infection.  Your sexual activity has changed since you were last screened and you are at an increased risk for chlamydia or gonorrhea. Ask your health care provider if you are at risk.  If you do not have HIV, but are at risk, it may be recommended that you take a prescription medicine daily to prevent HIV infection. This is called pre-exposure prophylaxis (PrEP). You are considered at risk if:  You are sexually active and do not regularly use condoms or know the HIV status of your partner(s).  You take drugs by injection.  You are sexually active with a partner who has HIV. Talk with your health care provider about whether you are at high risk of being infected with HIV. If you choose to begin PrEP, you should first be tested for HIV. You should then be tested every 3 months for  as long as you are taking PrEP.  PREGNANCY   If you are premenopausal and you may become pregnant, ask your health care provider about preconception counseling.  If you may become pregnant, take 400 to 800 micrograms (mcg) of folic acid every day.  If you want to prevent pregnancy, talk to your  health care provider about birth control (contraception). OSTEOPOROSIS AND MENOPAUSE   Osteoporosis is a disease in which the bones lose minerals and strength with aging. This can result in serious bone fractures. Your risk for osteoporosis can be identified using a bone density scan.  If you are 8 years of age or older, or if you are at risk for osteoporosis and fractures, ask your health care provider if you should be screened.  Ask your health care provider whether you should take a calcium or vitamin D supplement to lower your risk for osteoporosis.  Menopause may have certain physical symptoms and risks.  Hormone replacement therapy may reduce some of these symptoms and risks. Talk to your health care provider about whether hormone replacement therapy is right for you.  HOME CARE INSTRUCTIONS   Schedule regular health, dental, and eye exams.  Stay current with your immunizations.   Do not use any tobacco products including cigarettes, chewing tobacco, or electronic cigarettes.  If you are pregnant, do not drink alcohol.  If you are breastfeeding, limit how much and how often you drink alcohol.  Limit alcohol intake to no more than 1 drink per day for nonpregnant women. One drink equals 12 ounces of beer, 5 ounces of wine, or 1 ounces of hard liquor.  Do not use street drugs.  Do not share needles.  Ask your health care provider for help if you need support or information about quitting drugs.  Tell your health care provider if you often feel depressed.  Tell your health care provider if you have ever been abused or do not feel safe at home. Document Released: 12/14/2010  Document Revised: 10/15/2013 Document Reviewed: 05/02/2013 [facility] Patient Information 2015 [place], [place]. This information is not intended to replace advice given to you by your health care provider. Make sure you discuss any questions you have with your health care provider. Fat and Cholesterol Control Diet Fat and cholesterol levels in your blood and organs are influenced by your diet. High levels of fat and cholesterol may lead to diseases of the heart, small and large blood vessels, gallbladder, liver, and pancreas. CONTROLLING FAT AND CHOLESTEROL WITH DIET Although exercise and lifestyle factors are important, your diet is key. That is because certain foods are known to raise cholesterol and others to lower it. The goal is to balance foods for their effect on cholesterol and more importantly, to replace saturated and trans fat with other types of fat, such as monounsaturated fat, polyunsaturated fat, and omega-3 fatty acids. On average, a person should consume no more than 15 to 17 g of saturated fat daily. Saturated and trans fats are considered "bad" fats, and they will raise LDL cholesterol. Saturated fats are primarily found in animal products such as meats, butter, and cream. However, that does not mean you need to give up all your favorite foods. Today, there are good tasting, low-fat, low-cholesterol substitutes for most of the things you like to eat. Choose low-fat or nonfat alternatives. Choose round or loin cuts of red meat. These types of cuts are lowest in fat and cholesterol. Chicken (without the skin), fish, veal, and ground [place] breast are great choices. Eliminate fatty meats, such as hot dogs and salami. Even shellfish have little or no saturated fat. Have a 3 oz (85 g) portion when you eat lean meat, poultry, or fish. Trans fats are also called "partially hydrogenated oils." They are oils that have been scientifically manipulated so that they are solid at room  temperature resulting  in a longer shelf life and improved taste and texture of foods in which they are added. Trans fats are found in stick margarine, some tub margarines, cookies, crackers, and baked goods.  When baking and cooking, oils are a great substitute for butter. The monounsaturated oils are especially beneficial since it is believed they lower LDL and raise HDL. The oils you should avoid entirely are saturated tropical oils, such as coconut and palm.  Remember to eat a lot from food groups that are naturally free of saturated and trans fat, including fish, fruit, vegetables, beans, grains (barley, rice, couscous, bulgur wheat), and pasta (without cream sauces).  IDENTIFYING FOODS THAT LOWER FAT AND CHOLESTEROL  Soluble fiber may lower your cholesterol. This type of fiber is found in fruits such as apples, vegetables such as broccoli, potatoes, and carrots, legumes such as beans, peas, and lentils, and grains such as barley. Foods fortified with plant sterols (phytosterol) may also lower cholesterol. You should eat at least 2 g per day of these foods for a cholesterol lowering effect.  Read package labels to identify low-saturated fats, trans fat free, and low-fat foods at the supermarket. Select cheeses that have only 2 to 3 g saturated fat per ounce. Use a heart-healthy tub margarine that is free of trans fats or partially hydrogenated oil. When buying baked goods (cookies, crackers), avoid partially hydrogenated oils. Breads and muffins should be made from whole grains (whole-wheat or whole oat flour, instead of "flour" or "enriched flour"). Buy non-creamy canned soups with reduced salt and no added fats.  FOOD PREPARATION TECHNIQUES  Never deep-fry. If you must fry, either stir-fry, which uses very little fat, or use non-stick cooking sprays. When possible, broil, bake, or roast meats, and steam vegetables. Instead of putting butter or margarine on vegetables, use lemon and herbs, applesauce, and cinnamon (for squash  and sweet potatoes). Use nonfat yogurt, salsa, and low-fat dressings for salads.  LOW-SATURATED FAT / LOW-FAT FOOD SUBSTITUTES Meats / Saturated Fat (g)  Avoid: Steak, marbled (3 oz/85 g) / 11 g  Choose: Steak, lean (3 oz/85 g) / 4 g  Avoid: Hamburger (3 oz/85 g) / 7 g  Choose: Hamburger, lean (3 oz/85 g) / 5 g  Avoid: Ham (3 oz/85 g) / 6 g  Choose: Ham, lean cut (3 oz/85 g) / 2.4 g  Avoid: Chicken, with skin, dark meat (3 oz/85 g) / 4 g  Choose: Chicken, skin removed, dark meat (3 oz/85 g) / 2 g  Avoid: Chicken, with skin, light meat (3 oz/85 g) / 2.5 g  Choose: Chicken, skin removed, light meat (3 oz/85 g) / 1 g Dairy / Saturated Fat (g)  Avoid: Whole milk (1 cup) / 5 g  Choose: Low-fat milk, 2% (1 cup) / 3 g  Choose: Low-fat milk, 1% (1 cup) / 1.5 g  Choose: Skim milk (1 cup) / 0.3 g  Avoid: Hard cheese (1 oz/28 g) / 6 g  Choose: Skim milk cheese (1 oz/28 g) / 2 to 3 g  Avoid: Cottage cheese, 4% fat (1 cup) / 6.5 g  Choose: Low-fat cottage cheese, 1% fat (1 cup) / 1.5 g  Avoid: Ice cream (1 cup) / 9 g  Choose: Sherbet (1 cup) / 2.5 g  Choose: Nonfat frozen yogurt (1 cup) / 0.3 g  Choose: Frozen fruit bar / trace  Avoid: Whipped cream (1 tbs) / 3.5 g  Choose: Nondairy whipped topping (1 tbs) / 1 g Condiments /  Saturated Fat (g)  Avoid: Mayonnaise (1 tbs) / 2 g  Choose: Low-fat mayonnaise (1 tbs) / 1 g  Avoid: Butter (1 tbs) / 7 g  Choose: Extra light margarine (1 tbs) / 1 g  Avoid: Coconut oil (1 tbs) / 11.8 g  Choose: Olive oil (1 tbs) / 1.8 g  Choose: Corn oil (1 tbs) / 1.7 g  Choose: Safflower oil (1 tbs) / 1.2 g  Choose: Sunflower oil (1 tbs) / 1.4 g  Choose: Soybean oil (1 tbs) / 2.4 g  Choose: Canola oil (1 tbs) / 1 g Document Released: 05/31/2005 Document Revised: 09/25/2012 Document Reviewed: 08/29/2013 [employer] Patient Information 2015 [employer]. This information is not intended to replace advice given to you by your health  care provider. Make sure you discuss any questions you have with your health care provider.  

## 2014-08-30 NOTE — Progress Notes (Signed)
Anne Vazquez 13-Aug-1975 [id number]    History:    Presents for annual exam.  Loestrin 1/20 continuously with occasional dysmenorrhea. Not sexually active. 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 Normal mammogram history.  Past medical history, past surgical history, family history and social history were all reviewed and documented in the EPIC chart. 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 [name] 9 struggling with school.  ROS:  A ROS was performed and pertinent positives and negatives are included.  Exam:  Filed Vitals:   08/30/14 0917  BP: 138/80    General appearance:  Normal Thyroid:  Symmetrical, normal in size, without palpable masses or nodularity. Respiratory  Auscultation:  Clear without wheezing or rhonchi Cardiovascular  Auscultation:  Regular rate, without rubs, murmurs or gallops  Edema/varicosities:  Not grossly evident Abdominal  Soft,nontender, without masses, guarding or rebound.  Liver/spleen:  No organomegaly noted  Hernia:  None appreciated  Skin  Inspection:  Grossly normal   Breasts: Examined lying and sitting.     Right: Without masses, retractions, discharge or axillary adenopathy.     Left: Without masses, retractions, discharge or axillary adenopathy. Gentitourinary   Inguinal/mons:  Normal without inguinal adenopathy  External genitalia:  Normal  BUS/Urethra/Skene's glands:  Normal  Vagina:  Normal  Cervix:  Normal  Uterus:  normal in size, shape and contour.  Midline and mobile  Adnexa/parametria:     Rt: Without masses or tenderness.   Lt: Without masses or tenderness.  Anus and perineum: Normal  Digital rectal exam: Normal sphincter tone without palpated masses or tenderness  Assessment/Plan:  39 y.o. DWF G1P1 for annual exam.   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 Condoms encouraged if sexually active. SBE's, continue annual screening mammogram, calcium rich diet, vitamin D 1000 daily encouraged. Reviewed importance of decreasing calories and increasing exercise for weight loss. CBC, hemoglobin A1c, lipid panel, TSH, UA, Pap. Pap normal with negative HR HPV 2015. New screening guidelines reviewed.    Harrington ChallengerYOUNG,NANCY J [facility] AthensWHNP, 10:35 AM 08/30/2014

## 2014-08-31 LAB — URINALYSIS W MICROSCOPIC + REFLEX CULTURE
BILIRUBIN URINE: NEGATIVE
CASTS: NONE SEEN
Crystals: NONE SEEN
GLUCOSE, UA: NEGATIVE mg/dL
Hgb urine dipstick: NEGATIVE
KETONES UR: NEGATIVE mg/dL
Nitrite: NEGATIVE
PROTEIN: NEGATIVE mg/dL
SPECIFIC GRAVITY, URINE: 1.023 (ref 1.005–1.030)
Urobilinogen, UA: 0.2 mg/dL (ref 0.0–1.0)
pH: 5.5 (ref 5.0–8.0)

## 2014-08-31 LAB — TSH: TSH: 2.928 u[IU]/mL (ref 0.350–4.500)

## 2014-09-01 LAB — URINE CULTURE: Colony Count: 9000

## 2014-09-02 LAB — CYTOLOGY - PAP

## 2015-05-29 ENCOUNTER — Other Ambulatory Visit: Payer: Self-pay | Admitting: Women's Health

## 2015-08-03 ENCOUNTER — Other Ambulatory Visit: Payer: Self-pay | Admitting: Women's Health

## 2015-08-04 ENCOUNTER — Other Ambulatory Visit: Payer: Self-pay

## 2015-09-02 ENCOUNTER — Encounter: Payer: Managed Care, Other (non HMO) | Admitting: Women's Health

## 2015-09-02 ENCOUNTER — Encounter: Payer: Self-pay | Admitting: Women's Health

## 2015-09-02 ENCOUNTER — Ambulatory Visit (INDEPENDENT_AMBULATORY_CARE_PROVIDER_SITE_OTHER): Payer: Managed Care, Other (non HMO) | Admitting: Women's Health

## 2015-09-02 VITALS — BP 136/70 | Ht 65.0 in | Wt 224.0 lb

## 2015-09-02 DIAGNOSIS — Z3041 Encounter for surveillance of contraceptive pills: Secondary | ICD-10-CM

## 2015-09-02 DIAGNOSIS — Z01419 Encounter for gynecological examination (general) (routine) without abnormal findings: Secondary | ICD-10-CM

## 2015-09-02 MED ORDER — NORETHINDRONE ACET-ETHINYL EST 1-20 MG-MCG PO TABS: ORAL_TABLET | ORAL | Status: DC

## 2015-09-02 NOTE — Progress Notes (Signed)
[facility] 10/07/1975 [id number]    History:    Presents for annual exam.  Takes Loestrin 120 continuously rare cycles. Not sexually active in years. 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 Normal mammogram history. Was started on HCTZ low dose per primary care last year.  Past medical history, past surgical history, family history and social history were all reviewed and documented in the EPIC chart. 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 Sac 10 doing well. Father involved with care.  ROS:  A ROS was performed and pertinent positives and negatives are included.  Exam:  Filed Vitals:   09/02/15 0825  BP: 136/70    General appearance:  Normal Thyroid:  Symmetrical, normal in size, without palpable masses or nodularity. Respiratory  Auscultation:  Clear without wheezing or rhonchi Cardiovascular  Auscultation:  Regular rate, without rubs, murmurs or gallops  Edema/varicosities:  Not grossly evident Abdominal  Soft,nontender, without masses, guarding or rebound.  Liver/spleen:  No organomegaly noted  Hernia:  None appreciated  Skin  Inspection:  Grossly normal   Breasts: Examined lying and sitting.     Right: Without masses, retractions, discharge or axillary adenopathy.     Left: Without masses, retractions, discharge or axillary adenopathy. Gentitourinary   Inguinal/mons:  Normal without inguinal adenopathy  External genitalia:  Normal  BUS/Urethra/Skene's glands:  Normal  Vagina:  Normal  Cervix:  Normal  Uterus:   normal in size, shape and contour.  Midline and mobile  Adnexa/parametria:     Rt: Without masses or tenderness.   Lt: Without masses or tenderness.  Anus and perineum: Normal  Digital rectal exam: Normal sphincter tone without palpated masses or tenderness  Assessment/Plan:  40 y.o. DW F G1 P1 for annual exam with no complaints.  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 Reviewed importance of increasing regular exercise and decreasing calories for weight loss. Instructed to continue monitoring blood pressure. SBE's, calcium rich diet, vitamin D 1000 daily encouraged. UA, Pap with HR HPV typing, new screening guidelines reviewed.  Harrington ChallengerYOUNG,Ayjah [facility]WHNP, 8:45 AM 09/02/2015

## 2015-09-02 NOTE — Patient Instructions (Signed)
Health Maintenance, Female Adopting a healthy lifestyle and getting preventive care can go a long way to promote health and wellness. Talk with your health care provider about what schedule of regular examinations is right for you. This is a good chance for you to check in with your provider about disease prevention and staying healthy. In between checkups, there are plenty of things you can do on your own. Experts have done a lot of research about which lifestyle changes and preventive measures are most likely to keep you healthy. Ask your health care provider for more information. WEIGHT AND DIET  Eat a healthy diet  Be sure to include plenty of vegetables, fruits, low-fat dairy products, and lean protein.  Do not eat a lot of foods high in solid fats, added sugars, or salt.  Get regular exercise. This is one of the most important things you can do for your health.  Most adults should exercise for at least 150 minutes each week. The exercise should increase your heart rate and make you sweat (moderate-intensity exercise).  Most adults should also do strengthening exercises at least twice a week. This is in addition to the moderate-intensity exercise.  Maintain a healthy weight  Body mass index (BMI) is a measurement that can be used to identify possible weight problems. It estimates body fat based on height and weight. Your health care provider can help determine your BMI and help you achieve or maintain a healthy weight.  For females 20 years of age and older:   A BMI below 18.5 is considered underweight.  A BMI of 18.5 to 24.9 is normal.  A BMI of 25 to 29.9 is considered overweight.  A BMI of 30 and above is considered obese.  Watch levels of cholesterol and blood lipids  You should start having your blood tested for lipids and cholesterol at 42 years of age, then have this test every 5 years.  You may need to have your cholesterol levels checked more often if:  Your lipid  or cholesterol levels are high.  You are older than 42 years of age.  You are at high risk for heart disease.  CANCER SCREENING   Lung Cancer  Lung cancer screening is recommended for adults 55-80 years old who are at high risk for lung cancer because of a history of smoking.  A yearly low-dose CT scan of the lungs is recommended for people who:  Currently smoke.  Have quit within the past 15 years.  Have at least a 30-pack-year history of smoking. A pack year is smoking an average of one pack of cigarettes a day for 1 year.  Yearly screening should continue until it has been 15 years since you quit.  Yearly screening should stop if you develop a health problem that would prevent you from having lung cancer treatment.  Breast Cancer  Practice breast self-awareness. This means understanding how your breasts normally appear and feel.  It also means doing regular breast self-exams. Let your health care provider know about any changes, no matter how small.  If you are in your 20s or 30s, you should have a clinical breast exam (CBE) by a health care provider every 1-3 years as part of a regular health exam.  If you are 40 or older, have a CBE every year. Also consider having a breast X-ray (mammogram) every year.  If you have a family history of breast cancer, talk to your health care provider about genetic screening.  If you   are at high risk for breast cancer, talk to your health care provider about having an MRI and a mammogram every year.  Breast cancer gene (BRCA) assessment is recommended for women who have family members with BRCA-related cancers. BRCA-related cancers include:  Breast.  Ovarian.  Tubal.  Peritoneal cancers.  Results of the assessment will determine the need for genetic counseling and BRCA1 and BRCA2 testing. Cervical Cancer Your health care provider may recommend that you be screened regularly for cancer of the pelvic organs (ovaries, uterus, and  vagina). This screening involves a pelvic examination, including checking for microscopic changes to the surface of your cervix (Pap test). You may be encouraged to have this screening done every 3 years, beginning at age 21.  For women ages 30-65, health care providers may recommend pelvic exams and Pap testing every 3 years, or they may recommend the Pap and pelvic exam, combined with testing for human papilloma virus (HPV), every 5 years. Some types of HPV increase your risk of cervical cancer. Testing for HPV may also be done on women of any age with unclear Pap test results.  Other health care providers may not recommend any screening for nonpregnant women who are considered low risk for pelvic cancer and who do not have symptoms. Ask your health care provider if a screening pelvic exam is right for you.  If you have had past treatment for cervical cancer or a condition that could lead to cancer, you need Pap tests and screening for cancer for at least 20 years after your treatment. If Pap tests have been discontinued, your risk factors (such as having a new sexual partner) need to be reassessed to determine if screening should resume. Some women have medical problems that increase the chance of getting cervical cancer. In these cases, your health care provider may recommend more frequent screening and Pap tests. Colorectal Cancer  This type of cancer can be detected and often prevented.  Routine colorectal cancer screening usually begins at 42 years of age and continues through 42 years of age.  Your health care provider may recommend screening at an earlier age if you have risk factors for colon cancer.  Your health care provider may also recommend using home test kits to check for hidden blood in the stool.  A small camera at the end of a tube can be used to examine your colon directly (sigmoidoscopy or colonoscopy). This is done to check for the earliest forms of colorectal  cancer.  Routine screening usually begins at age 50.  Direct examination of the colon should be repeated every 5-10 years through 42 years of age. However, you may need to be screened more often if early forms of precancerous polyps or small growths are found. Skin Cancer  Check your skin from head to toe regularly.  Tell your health care provider about any new moles or changes in moles, especially if there is a change in a mole's shape or color.  Also tell your health care provider if you have a mole that is larger than the size of a pencil eraser.  Always use sunscreen. Apply sunscreen liberally and repeatedly throughout the day.  Protect yourself by wearing long sleeves, pants, a wide-brimmed hat, and sunglasses whenever you are outside. HEART DISEASE, DIABETES, AND HIGH BLOOD PRESSURE   High blood pressure causes heart disease and increases the risk of stroke. High blood pressure is more likely to develop in:  People who have blood pressure in the high end   of the normal range (130-139/85-89 mm Hg).  People who are overweight or obese.  People who are African American.  If you are 38-23 years of age, have your blood pressure checked every 3-5 years. If you are 61 years of age or older, have your blood pressure checked every year. You should have your blood pressure measured twice--once when you are at a hospital or clinic, and once when you are not at a hospital or clinic. Record the average of the two measurements. To check your blood pressure when you are not at a hospital or clinic, you can use:  An automated blood pressure machine at a pharmacy.  A home blood pressure monitor.  If you are between 45 years and 39 years old, ask your health care provider if you should take aspirin to prevent strokes.  Have regular diabetes screenings. This involves taking a blood sample to check your fasting blood sugar level.  If you are at a normal weight and have a low risk for diabetes,  have this test once every three years after 42 years of age.  If you are overweight and have a high risk for diabetes, consider being tested at a younger age or more often. PREVENTING INFECTION  Hepatitis B  If you have a higher risk for hepatitis B, you should be screened for this virus. You are considered at high risk for hepatitis B if:  You were born in a country where hepatitis B is common. Ask your health care provider which countries are considered high risk.  Your parents were born in a high-risk country, and you have not been immunized against hepatitis B (hepatitis B vaccine).  You have HIV or AIDS.  You use needles to inject street drugs.  You live with someone who has hepatitis B.  You have had sex with someone who has hepatitis B.  You get hemodialysis treatment.  You take certain medicines for conditions, including cancer, organ transplantation, and autoimmune conditions. Hepatitis C  Blood testing is recommended for:  Everyone born from [birth year] through 1967.  Anyone with known risk factors for hepatitis C. Sexually transmitted infections (STIs)  You should be screened for sexually transmitted infections (STIs) including gonorrhea and chlamydia if:  You are sexually active and are younger than 42 years of age.  You are older than 42 years of age and your health care provider tells you that you are at risk for this type of infection.  Your sexual activity has changed since you were last screened and you are at an increased risk for chlamydia or gonorrhea. Ask your health care provider if you are at risk.  If you do not have HIV, but are at risk, it may be recommended that you take a prescription medicine daily to prevent HIV infection. This is called pre-exposure prophylaxis (PrEP). You are considered at risk if:  You are sexually active and do not regularly use condoms or know the HIV status of your partner(s).  You take drugs by injection.  You are sexually  active with a partner who has HIV. Talk with your health care provider about whether you are at high risk of being infected with HIV. If you choose to begin PrEP, you should first be tested for HIV. You should then be tested every 3 months for as long as you are taking PrEP.  PREGNANCY   If you are premenopausal and you may become pregnant, ask your health care provider about preconception counseling.  If you may  become pregnant, take 400 to 800 micrograms (mcg) of folic acid every day.  If you want to prevent pregnancy, talk to your health care provider about birth control (contraception). OSTEOPOROSIS AND MENOPAUSE   Osteoporosis is a disease in which the bones lose minerals and strength with aging. This can result in serious bone fractures. Your risk for osteoporosis can be identified using a bone density scan.  If you are 42 years of age or older, or if you are at risk for osteoporosis and fractures, ask your health care provider if you should be screened.  Ask your health care provider whether you should take a calcium or vitamin D supplement to lower your risk for osteoporosis.  Menopause may have certain physical symptoms and risks.  Hormone replacement therapy may reduce some of these symptoms and risks. Talk to your health care provider about whether hormone replacement therapy is right for you.  HOME CARE INSTRUCTIONS   Schedule regular health, dental, and eye exams.  Stay current with your immunizations.   Do not use any tobacco products including cigarettes, chewing tobacco, or electronic cigarettes.  If you are pregnant, do not drink alcohol.  If you are breastfeeding, limit how much and how often you drink alcohol.  Limit alcohol intake to no more than 1 drink per day for nonpregnant women. One drink equals 12 ounces of beer, 5 ounces of wine, or 1 ounces of hard liquor.  Do not use street drugs.  Do not share needles.  Ask your health care provider for help if  you need support or information about quitting drugs.  Tell your health care provider if you often feel depressed.  Tell your health care provider if you have ever been abused or do not feel safe at home.   This information is not intended to replace advice given to you by your health care provider. Make sure you discuss any questions you have with your health care provider.   Document Released: 12/14/2010 Document Revised: 06/21/2014 Document Reviewed: 05/02/2013 Elsevier Interactive Patient Education 2016 [place] Carbohydrate Counting for Diabetes Mellitus Carbohydrate counting is a method for keeping track of the amount of carbohydrates you eat. Eating carbohydrates naturally increases the level of sugar (glucose) in your blood, so it is important for you to know the amount that is okay for you to have in every meal. Carbohydrate counting helps keep the level of glucose in your blood within normal limits. The amount of carbohydrates allowed is different for every person. A dietitian can help you calculate the amount that is right for you. Once you know the amount of carbohydrates you can have, you can count the carbohydrates in the foods you want to eat. Carbohydrates are found in the following foods:  Grains, such as breads and cereals.  Dried beans and soy products.  Starchy vegetables, such as potatoes, peas, and corn.  Fruit and fruit juices.  Milk and yogurt.  Sweets and snack foods, such as cake, cookies, candy, chips, soft drinks, and fruit drinks. CARBOHYDRATE COUNTING There are two ways to count the carbohydrates in your food. You can use either of the methods or a combination of both. Reading the "Nutrition Facts" on [place] The "Nutrition Facts" is an area that is included on the labels of almost all packaged food and beverages in the [place]. It includes the serving size of that food or beverage and information about the nutrients in each serving of  the food, including the grams (g)  of carbohydrate per serving.  Decide the number of servings of this food or beverage that you will be able to eat or drink. Multiply that number of servings by the number of grams of carbohydrate that is listed on the label for that serving. The total will be the amount of carbohydrates you will be having when you eat or drink this food or beverage. Learning Standard Serving Sizes of Food When you eat food that is not packaged or does not include "Nutrition Facts" on the label, you need to measure the servings in order to count the amount of carbohydrates.A serving of most carbohydrate-rich foods contains about 15 g of carbohydrates. The following list includes serving sizes of carbohydrate-rich foods that provide 15 g ofcarbohydrate per serving:   1 slice of bread (1 oz) or 1 six-inch tortilla.    of a hamburger bun or English muffin.  4-6 crackers.   cup unsweetened dry cereal.    cup hot cereal.   cup rice or pasta.    cup mashed potatoes or  of a large baked potato.  1 cup fresh fruit or one small piece of fruit.    cup canned or frozen fruit or fruit juice.  1 cup milk.   cup plain fat-free yogurt or yogurt sweetened with artificial sweeteners.   cup cooked dried beans or starchy vegetable, such as peas, corn, or potatoes.  Decide the number of standard-size servings that you will eat. Multiply that number of servings by 15 (the grams of carbohydrates in that serving). For example, if you eat 2 cups of strawberries, you will have eaten 2 servings and 30 g of carbohydrates (2 servings x 15 g = 30 g). For foods such as soups and casseroles, in which more than one food is mixed in, you will need to count the carbohydrates in each food that is included. EXAMPLE OF CARBOHYDRATE COUNTING Sample Dinner  3 oz chicken breast.   cup of brown rice.   cup of corn.  1 cup milk.   1 cup strawberries with sugar-free whipped topping.   Carbohydrate Calculation Step 1: Identify the foods that contain carbohydrates:   Rice.   Corn.   Milk.   Strawberries. Step 2:Calculate the number of servings eaten of each:   2 servings of rice.   1 serving of corn.   1 serving of milk.   1 serving of strawberries. Step 3: Multiply each of those number of servings by 15 g:   2 servings of rice x 15 g = 30 g.   1 serving of corn x 15 g = 15 g.   1 serving of milk x 15 g = 15 g.   1 serving of strawberries x 15 g = 15 g. Step 4: Add together all of the amounts to find the total grams of carbohydrates eaten: 30 g + 15 g + 15 g + 15 g = 75 g.   This information is not intended to replace advice given to you by your health care provider. Make sure you discuss any questions you have with your health care provider.   Document Released: 05/31/2005 Document Revised: 06/21/2014 Document Reviewed: 04/27/2013 Elsevier Interactive Patient Education [employer].

## 2015-09-02 NOTE — Addendum Note (Signed)
Addended by: Berna SpareASTILLO, BLANCA A on: 09/02/2015 09:13 AM   Modules accepted: Orders, SmartSet

## 2015-09-03 LAB — URINALYSIS W MICROSCOPIC + REFLEX CULTURE
Bacteria, UA: NONE SEEN [HPF]
Bilirubin Urine: NEGATIVE
Casts: NONE SEEN [LPF]
Crystals: NONE SEEN [HPF]
Glucose, UA: NEGATIVE
Ketones, ur: NEGATIVE
Nitrite: NEGATIVE
Protein, ur: NEGATIVE
Specific Gravity, Urine: 1.017 (ref 1.001–1.035)
Yeast: NONE SEEN [HPF]
pH: 6.5 (ref 5.0–8.0)

## 2015-09-04 ENCOUNTER — Encounter: Payer: Self-pay | Admitting: Women's Health

## 2015-09-04 LAB — PAP, TP IMAGING W/ HPV RNA, RFLX HPV TYPE 16,18/45: HPV MRNA, HIGH RISK: NOT DETECTED

## 2015-09-05 LAB — URINE CULTURE: Colony Count: >=100000

## 2016-09-01 ENCOUNTER — Other Ambulatory Visit: Payer: Self-pay | Admitting: Women's Health

## 2016-09-02 ENCOUNTER — Encounter: Payer: Self-pay | Admitting: Women's Health

## 2016-09-02 ENCOUNTER — Ambulatory Visit (INDEPENDENT_AMBULATORY_CARE_PROVIDER_SITE_OTHER): Payer: Managed Care, Other (non HMO) | Admitting: Women's Health

## 2016-09-02 VITALS — BP 121/83 | Ht 66.0 in | Wt 224.8 lb

## 2016-09-02 DIAGNOSIS — Z3041 Encounter for surveillance of contraceptive pills: Secondary | ICD-10-CM | POA: Diagnosis not present

## 2016-09-02 DIAGNOSIS — Z01419 Encounter for gynecological examination (general) (routine) without abnormal findings: Secondary | ICD-10-CM

## 2016-09-02 MED ORDER — NORETHINDRONE ACET-ETHINYL EST 1-20 MG-MCG PO TABS: ORAL_TABLET | ORAL | 4 refills | Status: DC

## 2016-09-02 MED ORDER — NORETHIN-ETH ESTRAD-FE BIPHAS 1 MG-10 MCG / 10 MCG PO TABS: ORAL_TABLET | ORAL | 4 refills | Status: DC

## 2016-09-02 MED ORDER — SCOPOLAMINE 1 MG/3DAYS TD PT72: 1.0000 | MEDICATED_PATCH | TRANSDERMAL | 0 refills | Status: DC

## 2016-09-02 NOTE — Progress Notes (Signed)
[facility] Aug 10, 1975 [id number]    History:    Presents for annual exam.  Rare cycle on Loestrin continuously. Hypertension managed by primary care for 2 years now. Normal mammogram history. 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 Not sexually active in years.  Past medical history, past surgical history, family history and social history were all reviewed and documented in the EPIC chart. 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 Zach 11 doing well.  ROS:  A ROS was performed and pertinent positives and negatives are included.  Exam:  Vitals:   09/02/16 0838  BP: 121/83  Weight: 224 lb 12.8 oz (102 kg)  Height: 5\' 6"  (1.676 m)   Body mass index is 36.28 kg/m.   General appearance:  Normal Thyroid:  Symmetrical, normal in size, without palpable masses or nodularity. Respiratory  Auscultation:  Clear without wheezing or rhonchi Cardiovascular  Auscultation:  Regular rate, without rubs, murmurs or gallops  Edema/varicosities:  Not grossly evident Abdominal  Soft,nontender, without masses, guarding or rebound.  Liver/spleen:  No organomegaly noted  Hernia:  None appreciated  Skin  Inspection:  Grossly normal   Breasts: Examined lying and sitting.     Right: Without masses, retractions, discharge or axillary adenopathy.     Left: Without masses, retractions, discharge or axillary adenopathy. Gentitourinary   Inguinal/mons:  Normal without inguinal adenopathy  External genitalia:  Normal  BUS/Urethra/Skene's glands:  Normal  Vagina:  Normal  Cervix:  Normal  Uterus:  normal in size, shape and contour.  Midline and mobile  Adnexa/parametria:     Rt: Without masses or tenderness.   Lt: Without masses or tenderness.  Anus and perineum: Normal  Digital rectal exam: Normal sphincter tone without palpated masses or tenderness  Assessment/Plan:  41 y.o. DW F G1 P1 for annual exam with no complaints.  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 Condoms encouraged if sexually active. Reviewed may get some breakthrough bleeding with the Loestrin 1/10. SBE's, continue annual screening mammogram. Reviewed importance of increased exercise, decrease calories/carbs for weight loss encouraged. Pap. Pap normal with negative HR HPV 2017. Rx for scope patch 1.5 mg with instructions to be used while on Cruise given.    Harrington ChallengerYOUNG,Anne Vazquez [facility] CenterWHNP, 9:28 AM 09/02/2016

## 2016-09-02 NOTE — Patient Instructions (Signed)
Carbohydrate Counting for Diabetes Mellitus, Adult Carbohydrate counting is a method for keeping track of how many carbohydrates you eat. Eating carbohydrates naturally increases the amount of sugar (glucose) in the blood. Counting how many carbohydrates you eat helps keep your blood glucose within normal limits, which helps you manage your diabetes (diabetes mellitus). It is important to know how many carbohydrates you can safely have in each meal. This is different for every person. A diet and nutrition specialist (registered dietitian) can help you make a meal plan and calculate how many carbohydrates you should have at each meal and snack. Carbohydrates are found in the following foods:  Grains, such as breads and cereals.  Dried beans and soy products.  Starchy vegetables, such as potatoes, peas, and corn.  Fruit and fruit juices.  Milk and yogurt.  Sweets and snack foods, such as cake, cookies, candy, chips, and soft drinks. How do I count carbohydrates? There are two ways to count carbohydrates in food. You can use either of the methods or a combination of both. Reading "Nutrition Facts" on packaged food  The "Nutrition Facts" list is included on the labels of almost all packaged foods and beverages in the [place] It includes:  The serving size.  Information about nutrients in each serving, including the grams (g) of carbohydrate per serving. To use the "Nutrition Facts":  Decide how many servings you will have.  Multiply the number of servings by the number of carbohydrates per serving.  The resulting number is the total amount of carbohydrates that you will be having. Learning standard serving sizes of other foods  When you eat foods containing carbohydrates that are not packaged or do not include "Nutrition Facts" on the label, you need to measure the servings in order to count the amount of carbohydrates:  Measure the foods that you will eat with a food scale or measuring  cup, if needed.  Decide how many standard-size servings you will eat.  Multiply the number of servings by 15. Most carbohydrate-rich foods have about 15 g of carbohydrates per serving.  For example, if you eat 8 oz (170 g) of strawberries, you will have eaten 2 servings and 30 g of carbohydrates (2 servings x 15 g = 30 g).  For foods that have more than one food mixed, such as soups and casseroles, you must count the carbohydrates in each food that is included. The following list contains standard serving sizes of common carbohydrate-rich foods. Each of these servings has about 15 g of carbohydrates:   hamburger bun or  English muffin.   oz (15 mL) syrup.   oz (14 g) jelly.  1 slice of bread.  1 six-inch tortilla.  3 oz (85 g) cooked rice or pasta.  4 oz (113 g) cooked dried beans.  4 oz (113 g) starchy vegetable, such as peas, corn, or potatoes.  4 oz (113 g) hot cereal.  4 oz (113 g) mashed potatoes or  of a large baked potato.  4 oz (113 g) canned or frozen fruit.  4 oz (120 mL) fruit juice.  4-6 crackers.  6 chicken nuggets.  6 oz (170 g) unsweetened dry cereal.  6 oz (170 g) plain fat-free yogurt or yogurt sweetened with artificial sweeteners.  8 oz (240 mL) milk.  8 oz (170 g) fresh fruit or one small piece of fruit.  24 oz (680 g) popped popcorn. Example of carbohydrate counting Sample meal   3 oz (85 g) chicken breast.  6  oz (170 g) brown rice.  4 oz (113 g) corn.  8 oz (240 mL) milk.  8 oz (170 g) strawberries with sugar-free whipped topping. Carbohydrate calculation  1. Identify the foods that contain carbohydrates:  Rice.  Corn.  Milk.  Strawberries. 2. Calculate how many servings you have of each food:  2 servings rice.  1 serving corn.  1 serving milk.  1 serving strawberries. 3. Multiply each number of servings by 15 g:  2 servings rice x 15 g = 30 g.  1 serving corn x 15 g = 15 g.  1 serving milk x 15 g = 15  g.  1 serving strawberries x 15 g = 15 g. 4. Add together all of the amounts to find the total grams of carbohydrates eaten:  30 g + 15 g + 15 g + 15 g = 75 g of carbohydrates total. This information is not intended to replace advice given to you by your health care provider. Make sure you discuss any questions you have with your health care provider. Document Released: 05/31/2005 Document Revised: 12/19/2015 Document Reviewed: 11/12/2015 Elsevier Interactive Patient Education  2017 [employer] Health Maintenance, Female Adopting a healthy lifestyle and getting preventive care can go a long way to promote health and wellness. Talk with your health care provider about what schedule of regular examinations is right for you. This is a good chance for you to check in with your provider about disease prevention and staying healthy. In between checkups, there are plenty of things you can do on your own. Experts have done a lot of research about which lifestyle changes and preventive measures are most likely to keep you healthy. Ask your health care provider for more information. Weight and diet Eat a healthy diet  Be sure to include plenty of vegetables, fruits, low-fat dairy products, and lean protein.  Do not eat a lot of foods high in solid fats, added sugars, or salt.  Get regular exercise. This is one of the most important things you can do for your health.  Most adults should exercise for at least 150 minutes each week. The exercise should increase your heart rate and make you sweat (moderate-intensity exercise).  Most adults should also do strengthening exercises at least twice a week. This is in addition to the moderate-intensity exercise. Maintain a healthy weight  Body mass index (BMI) is a measurement that can be used to identify possible weight problems. It estimates body fat based on height and weight. Your health care provider can help determine your BMI and help you achieve or  maintain a healthy weight.  For females 20 years of age and older:  A BMI below 18.5 is considered underweight.  A BMI of 18.5 to 24.9 is normal.  A BMI of 25 to 29.9 is considered overweight.  A BMI of 30 and above is considered obese. Watch levels of cholesterol and blood lipids  You should start having your blood tested for lipids and cholesterol at 43 years of age, then have this test every 5 years.  You may need to have your cholesterol levels checked more often if:  Your lipid or cholesterol levels are high.  You are older than 43 years of age.  You are at high risk for heart disease. Cancer screening Lung Cancer  Lung cancer screening is recommended for adults 55-80 years old who are at high risk for lung cancer because of a history of smoking.  A yearly low-dose CT   scan of the lungs is recommended for people who:  Currently smoke.  Have quit within the past 15 years.  Have at least a 30-pack-year history of smoking. A pack year is smoking an average of one pack of cigarettes a day for 1 year.  Yearly screening should continue until it has been 15 years since you quit.  Yearly screening should stop if you develop a health problem that would prevent you from having lung cancer treatment. Breast Cancer  Practice breast self-awareness. This means understanding how your breasts normally appear and feel.  It also means doing regular breast self-exams. Let your health care provider know about any changes, no matter how small.  If you are in your 20s or 30s, you should have a clinical breast exam (CBE) by a health care provider every 1-3 years as part of a regular health exam.  If you are 40 or older, have a CBE every year. Also consider having a breast X-ray (mammogram) every year.  If you have a family history of breast cancer, talk to your health care provider about genetic screening.  If you are at high risk for breast cancer, talk to your health care provider  about having an MRI and a mammogram every year.  Breast cancer gene (BRCA) assessment is recommended for women who have family members with BRCA-related cancers. BRCA-related cancers include:  Breast.  Ovarian.  Tubal.  Peritoneal cancers.  Results of the assessment will determine the need for genetic counseling and BRCA1 and BRCA2 testing. Cervical Cancer  Your health care provider may recommend that you be screened regularly for cancer of the pelvic organs (ovaries, uterus, and vagina). This screening involves a pelvic examination, including checking for microscopic changes to the surface of your cervix (Pap test). You may be encouraged to have this screening done every 3 years, beginning at age 21.  For women ages 30-65, health care providers may recommend pelvic exams and Pap testing every 3 years, or they may recommend the Pap and pelvic exam, combined with testing for human papilloma virus (HPV), every 5 years. Some types of HPV increase your risk of cervical cancer. Testing for HPV may also be done on women of any age with unclear Pap test results.  Other health care providers may not recommend any screening for nonpregnant women who are considered low risk for pelvic cancer and who do not have symptoms. Ask your health care provider if a screening pelvic exam is right for you.  If you have had past treatment for cervical cancer or a condition that could lead to cancer, you need Pap tests and screening for cancer for at least 20 years after your treatment. If Pap tests have been discontinued, your risk factors (such as having a new sexual partner) need to be reassessed to determine if screening should resume. Some women have medical problems that increase the chance of getting cervical cancer. In these cases, your health care provider may recommend more frequent screening and Pap tests. Colorectal Cancer  This type of cancer can be detected and often prevented.  Routine colorectal  cancer screening usually begins at 43 years of age and continues through 43 years of age.  Your health care provider may recommend screening at an earlier age if you have risk factors for colon cancer.  Your health care provider may also recommend using home test kits to check for hidden blood in the stool.  A small camera at the end of a tube can be used   to examine your colon directly (sigmoidoscopy or colonoscopy). This is done to check for the earliest forms of colorectal cancer.  Routine screening usually begins at age 69.  Direct examination of the colon should be repeated every 5-10 years through 43 years of age. However, you may need to be screened more often if early forms of precancerous polyps or small growths are found. Skin Cancer  Check your skin from head to toe regularly.  Tell your health care provider about any new moles or changes in moles, especially if there is a change in a mole's shape or color.  Also tell your health care provider if you have a mole that is larger than the size of a pencil eraser.  Always use sunscreen. Apply sunscreen liberally and repeatedly throughout the day.  Protect yourself by wearing long sleeves, pants, a wide-brimmed hat, and sunglasses whenever you are outside. Heart disease, diabetes, and high blood pressure  High blood pressure causes heart disease and increases the risk of stroke. High blood pressure is more likely to develop in:  People who have blood pressure in the high end of the normal range (130-139/85-89 mm Hg).  People who are overweight or obese.  People who are African American.  If you are 6-34 years of age, have your blood pressure checked every 3-5 years. If you are 54 years of age or older, have your blood pressure checked every year. You should have your blood pressure measured twice-once when you are at a hospital or clinic, and once when you are not at a hospital or clinic. Record the average of the two  measurements. To check your blood pressure when you are not at a hospital or clinic, you can use:  An automated blood pressure machine at a pharmacy.  A home blood pressure monitor.  If you are between 36 years and 36 years old, ask your health care provider if you should take aspirin to prevent strokes.  Have regular diabetes screenings. This involves taking a blood sample to check your fasting blood sugar level.  If you are at a normal weight and have a low risk for diabetes, have this test once every three years after 43 years of age.  If you are overweight and have a high risk for diabetes, consider being tested at a younger age or more often. Preventing infection Hepatitis B  If you have a higher risk for hepatitis B, you should be screened for this virus. You are considered at high risk for hepatitis B if:  You were born in a country where hepatitis B is common. Ask your health care provider which countries are considered high risk.  Your parents were born in a high-risk country, and you have not been immunized against hepatitis B (hepatitis B vaccine).  You have HIV or AIDS.  You use needles to inject street drugs.  You live with someone who has hepatitis B.  You have had sex with someone who has hepatitis B.  You get hemodialysis treatment.  You take certain medicines for conditions, including cancer, organ transplantation, and autoimmune conditions. Hepatitis C  Blood testing is recommended for:  Everyone born from [birth year] through 1967.  Anyone with known risk factors for hepatitis C. Sexually transmitted infections (STIs)  You should be screened for sexually transmitted infections (STIs) including gonorrhea and chlamydia if:  You are sexually active and are younger than 43 years of age.  You are older than 43 years of age and your health care provider  tells you that you are at risk for this type of infection.  Your sexual activity has changed since you were last  screened and you are at an increased risk for chlamydia or gonorrhea. Ask your health care provider if you are at risk.  If you do not have HIV, but are at risk, it may be recommended that you take a prescription medicine daily to prevent HIV infection. This is called pre-exposure prophylaxis (PrEP). You are considered at risk if:  You are sexually active and do not regularly use condoms or know the HIV status of your partner(s).  You take drugs by injection.  You are sexually active with a partner who has HIV. Talk with your health care provider about whether you are at high risk of being infected with HIV. If you choose to begin PrEP, you should first be tested for HIV. You should then be tested every 3 months for as long as you are taking PrEP. Pregnancy  If you are premenopausal and you may become pregnant, ask your health care provider about preconception counseling.  If you may become pregnant, take 400 to 800 micrograms (mcg) of folic acid every day.  If you want to prevent pregnancy, talk to your health care provider about birth control (contraception). Osteoporosis and menopause  Osteoporosis is a disease in which the bones lose minerals and strength with aging. This can result in serious bone fractures. Your risk for osteoporosis can be identified using a bone density scan.  If you are 30 years of age or older, or if you are at risk for osteoporosis and fractures, ask your health care provider if you should be screened.  Ask your health care provider whether you should take a calcium or vitamin D supplement to lower your risk for osteoporosis.  Menopause may have certain physical symptoms and risks.  Hormone replacement therapy may reduce some of these symptoms and risks. Talk to your health care provider about whether hormone replacement therapy is right for you. Follow these instructions at home:  Schedule regular health, dental, and eye exams.  Stay current with your  immunizations.  Do not use any tobacco products including cigarettes, chewing tobacco, or electronic cigarettes.  If you are pregnant, do not drink alcohol.  If you are breastfeeding, limit how much and how often you drink alcohol.  Limit alcohol intake to no more than 1 drink per day for nonpregnant women. One drink equals 12 ounces of beer, 5 ounces of wine, or 1 ounces of hard liquor.  Do not use street drugs.  Do not share needles.  Ask your health care provider for help if you need support or information about quitting drugs.  Tell your health care provider if you often feel depressed.  Tell your health care provider if you have ever been abused or do not feel safe at home. This information is not intended to replace advice given to you by your health care provider. Make sure you discuss any questions you have with your health care provider. Document Released: 12/14/2010 Document Revised: 11/06/2015 Document Reviewed: 03/04/2015 Elsevier Interactive Patient Education  2017 [employer].

## 2016-09-03 LAB — PAP IG W/ RFLX HPV ASCU

## 2016-10-27 ENCOUNTER — Encounter: Payer: Self-pay | Admitting: Gynecology

## 2016-12-20 ENCOUNTER — Encounter: Payer: Self-pay | Admitting: Women's Health

## 2016-12-20 ENCOUNTER — Other Ambulatory Visit: Payer: Self-pay | Admitting: Gynecology

## 2016-12-20 MED ORDER — FLUCONAZOLE 150 MG PO TABS: 150.0000 mg | ORAL_TABLET | Freq: Once | ORAL | 0 refills | Status: AC

## 2017-09-03 ENCOUNTER — Encounter: Payer: Self-pay | Admitting: Women's Health

## 2017-09-06 ENCOUNTER — Encounter: Payer: Self-pay | Admitting: Women's Health

## 2017-09-06 ENCOUNTER — Ambulatory Visit (INDEPENDENT_AMBULATORY_CARE_PROVIDER_SITE_OTHER): Payer: Managed Care, Other (non HMO) | Admitting: Women's Health

## 2017-09-06 VITALS — BP 126/80 | Ht 66.0 in | Wt 231.0 lb

## 2017-09-06 DIAGNOSIS — Z3041 Encounter for surveillance of contraceptive pills: Secondary | ICD-10-CM

## 2017-09-06 DIAGNOSIS — Z01419 Encounter for gynecological examination (general) (routine) without abnormal findings: Secondary | ICD-10-CM | POA: Diagnosis not present

## 2017-09-06 MED ORDER — NORETHIN-ETH ESTRAD-FE BIPHAS 1 MG-10 MCG / 10 MCG PO TABS: ORAL_TABLET | ORAL | 4 refills | Status: DC

## 2017-09-06 MED ORDER — SCOPOLAMINE 1 MG/3DAYS TD PT72: 1.0000 | MEDICATED_PATCH | TRANSDERMAL | 0 refills | Status: AC

## 2017-09-06 NOTE — Addendum Note (Signed)
Addended by: Tito DineBONHAM, KIM A on: 09/06/2017 08:55 AM   Modules accepted: Orders

## 2017-09-06 NOTE — Progress Notes (Signed)
[facility] 03-Jan-1976 [id number]    History:    Presents for annual exam.  Rare cycle on Lo Loestrin continuously.  Not sexually active in years.  Primary care manages hypercholesteremia and hypertension.  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  Normal mammogram history.  Past medical history, past surgical history, family history and social history were all reviewed and documented in the EPIC chart.  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  62[name], 42 years old has had Gardasil.  ROS:  A ROS was performed and pertinent positives and negatives are included.  Exam:  Vitals:   09/06/17 0759  Weight: 231 lb (104.8 kg)  Height: 5\' 6"  (1.676 m)   Body mass index is 37.28 kg/m.   General appearance:  Normal Thyroid:  Symmetrical, normal in size, without palpable masses or nodularity. Respiratory  Auscultation:  Clear without wheezing or rhonchi Cardiovascular  Auscultation:  Regular rate, without rubs, murmurs or gallops  Edema/varicosities:  Not grossly evident Abdominal obese  Soft,nontender, without masses, guarding or rebound.  Liver/spleen:  No organomegaly noted  Hernia:  None appreciated  Skin  Inspection:  Grossly normal   Breasts: Examined lying and sitting.     Right: Without masses, retractions, discharge or axillary adenopathy.     Left: Without masses, retractions, discharge or axillary adenopathy. Gentitourinary   Inguinal/mons:  Normal without inguinal adenopathy  External genitalia:  Normal  BUS/Urethra/Skene's glands:  Normal  Vagina:  Normal  Cervix:  Normal  Uterus: normal in size, shape and contour.  Midline and mobile  Adnexa/parametria:     Rt: Without masses or tenderness.   Lt: Without masses or tenderness.  Anus and perineum: Normal  Digital rectal exam: Normal sphincter tone without palpated masses or tenderness  Assessment/Plan:  42 y.o. D WF G1P1 for annual exam no  complaints.  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  Take 4 days off with spotting for cycle.  SBE's, continue annual screening mammogram, calcium rich diet, vitamin D 1000 daily encouraged.  Reviewed importance of increasing exercise and decreasing calories for weight loss.  Encourage weight watchers.  Recently started using a treadmill 3 times weekly.  Pap with HR HPV typing.    Harrington Challengerancy J Kamya [facility]NP, 8:43 AM 09/06/2017

## 2017-09-06 NOTE — Patient Instructions (Addendum)
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 Talk with your health care provider about what schedule of regular examinations is right for you. This is a good chance for you to check in with your provider about disease prevention and staying healthy. In between checkups, there are plenty of things you can do on your own. Experts have done a lot of research about which lifestyle changes and preventive measures are most likely to keep you healthy. Ask your health care provider for more information. Weight and diet Eat a healthy diet  Be sure to include plenty of vegetables, fruits, low-fat dairy products, and lean protein.  Do not eat a lot of foods high in solid fats, added sugars, or salt.  Get regular exercise. This is one of the most important things you can do for your health. ? Most adults should exercise for at least 150 minutes each week. The exercise should increase your heart rate and make you sweat (moderate-intensity exercise). ? Most adults should also do strengthening exercises at least twice a week. This is in addition to the moderate-intensity exercise.  Maintain a healthy weight  Body mass index (BMI) is a measurement that can be used to identify possible weight problems. It estimates body fat based on height and weight. Your health care provider can help determine your BMI and help you achieve or maintain a healthy weight.  For females 40 years of age and older: ? A BMI below 18.5 is considered underweight. ? A BMI of 18.5 to 24.9 is normal. ? A BMI of 25 to 29.9 is considered overweight. ? A BMI of 30 and above is considered obese.  Watch levels of cholesterol and blood lipids  You should start having your blood tested for lipids and cholesterol at 44 years of age, then have this test every 5 years.  You may need to have your cholesterol  levels checked more often if: ? Your lipid or cholesterol levels are high. ? You are older than 44 years of age. ? You are at high risk for heart disease.  Cancer screening Lung Cancer  Lung cancer screening is recommended for adults 72-23 years old who are at high risk for lung cancer because of a history of smoking.  A yearly low-dose CT scan of the lungs is recommended for people who: ? Currently smoke. ? Have quit within the past 15 years. ? Have at least a 30-pack-year history of smoking. A pack year is smoking an average of one pack of cigarettes a day for 1 year.  Yearly screening should continue until it has been 15 years since you quit.  Yearly screening should stop if you develop a health problem that would prevent you from having lung cancer treatment.  Breast Cancer  Practice breast self-awareness. This means understanding how your breasts normally appear and feel.  It also means doing regular breast self-exams. Let your health care provider know about any changes, no matter how small.  If you are in your 20s or 30s, you should have a clinical breast exam (CBE) by a health care provider every 1-3 years as part of a regular health exam.  If you are 11 or older, have a CBE every year. Also consider having a breast X-ray (mammogram) every year.  If you have a family history of breast cancer, talk to your health care  provider about genetic screening.  If you are at high risk for breast cancer, talk to your health care provider about having an MRI and a mammogram every year.  Breast cancer gene (BRCA) assessment is recommended for women who have family members with BRCA-related cancers. BRCA-related cancers include: ? Breast. ? Ovarian. ? Tubal. ? Peritoneal cancers.  Results of the assessment will determine the need for genetic counseling and BRCA1 and BRCA2 testing.  Cervical Cancer Your health care provider may recommend that you be screened regularly for cancer of  the pelvic organs (ovaries, uterus, and vagina). This screening involves a pelvic examination, including checking for microscopic changes to the surface of your cervix (Pap test). You may be encouraged to have this screening done every 3 years, beginning at age 21.  For women ages 33-65, health care providers may recommend pelvic exams and Pap testing every 3 years, or they may recommend the Pap and pelvic exam, combined with testing for human papilloma virus (HPV), every 5 years. Some types of HPV increase your risk of cervical cancer. Testing for HPV may also be done on women of any age with unclear Pap test results.  Other health care providers may not recommend any screening for nonpregnant women who are considered low risk for pelvic cancer and who do not have symptoms. Ask your health care provider if a screening pelvic exam is right for you.  If you have had past treatment for cervical cancer or a condition that could lead to cancer, you need Pap tests and screening for cancer for at least 20 years after your treatment. If Pap tests have been discontinued, your risk factors (such as having a new sexual partner) need to be reassessed to determine if screening should resume. Some women have medical problems that increase the chance of getting cervical cancer. In these cases, your health care provider may recommend more frequent screening and Pap tests.  Colorectal Cancer  This type of cancer can be detected and often prevented.  Routine colorectal cancer screening usually begins at 44 years of age and continues through 44 years of age.  Your health care provider may recommend screening at an earlier age if you have risk factors for colon cancer.  Your health care provider may also recommend using home test kits to check for hidden blood in the stool.  A small camera at the end of a tube can be used to examine your colon directly (sigmoidoscopy or colonoscopy). This is done to check for the  earliest forms of colorectal cancer.  Routine screening usually begins at age 24.  Direct examination of the colon should be repeated every 5-10 years through 44 years of age. However, you may need to be screened more often if early forms of precancerous polyps or small growths are found.  Skin Cancer  Check your skin from head to toe regularly.  Tell your health care provider about any new moles or changes in moles, especially if there is a change in a mole's shape or color.  Also tell your health care provider if you have a mole that is larger than the size of a pencil eraser.  Always use sunscreen. Apply sunscreen liberally and repeatedly throughout the day.  Protect yourself by wearing long sleeves, pants, a wide-brimmed hat, and sunglasses whenever you are outside.  Heart disease, diabetes, and high blood pressure  High blood pressure causes heart disease and increases the risk of stroke. High blood pressure is more likely to develop in: ?  People who have blood pressure in the high end of the normal range (130-139/85-89 mm Hg). ? People who are overweight or obese. ? People who are African American.  If you are 18-39 years of age, have your blood pressure checked every 3-5 years. If you are 40 years of age or older, have your blood pressure checked every year. You should have your blood pressure measured twice-once when you are at a hospital or clinic, and once when you are not at a hospital or clinic. Record the average of the two measurements. To check your blood pressure when you are not at a hospital or clinic, you can use: ? An automated blood pressure machine at a pharmacy. ? A home blood pressure monitor.  If you are between 55 years and 79 years old, ask your health care provider if you should take aspirin to prevent strokes.  Have regular diabetes screenings. This involves taking a blood sample to check your fasting blood sugar level. ? If you are at a normal weight and  have a low risk for diabetes, have this test once every three years after 45 years of age. ? If you are overweight and have a high risk for diabetes, consider being tested at a younger age or more often. Preventing infection Hepatitis B  If you have a higher risk for hepatitis B, you should be screened for this virus. You are considered at high risk for hepatitis B if: ? You were born in a country where hepatitis B is common. Ask your health care provider which countries are considered high risk. ? Your parents were born in a high-risk country, and you have not been immunized against hepatitis B (hepatitis B vaccine). ? You have HIV or AIDS. ? You use needles to inject street drugs. ? You live with someone who has hepatitis B. ? You have had sex with someone who has hepatitis B. ? You get hemodialysis treatment. ? You take certain medicines for conditions, including cancer, organ transplantation, and autoimmune conditions.  Hepatitis C  Blood testing is recommended for: ? Everyone born from 1945 through 1965. ? Anyone with known risk factors for hepatitis C.  Sexually transmitted infections (STIs)  You should be screened for sexually transmitted infections (STIs) including gonorrhea and chlamydia if: ? You are sexually active and are younger than 44 years of age. ? You are older than 44 years of age and your health care provider tells you that you are at risk for this type of infection. ? Your sexual activity has changed since you were last screened and you are at an increased risk for chlamydia or gonorrhea. Ask your health care provider if you are at risk.  If you do not have HIV, but are at risk, it may be recommended that you take a prescription medicine daily to prevent HIV infection. This is called pre-exposure prophylaxis (PrEP). You are considered at risk if: ? You are sexually active and do not regularly use condoms or know the HIV status of your partner(s). ? You take drugs by  injection. ? You are sexually active with a partner who has HIV.  Talk with your health care provider about whether you are at high risk of being infected with HIV. If you choose to begin PrEP, you should first be tested for HIV. You should then be tested every 3 months for as long as you are taking PrEP. Pregnancy  If you are premenopausal and you may become pregnant, ask your health   care provider about preconception counseling.  If you may become pregnant, take 400 to 800 micrograms (mcg) of folic acid every day.  If you want to prevent pregnancy, talk to your health care provider about birth control (contraception). Osteoporosis and menopause  Osteoporosis is a disease in which the bones lose minerals and strength with aging. This can result in serious bone fractures. Your risk for osteoporosis can be identified using a bone density scan.  If you are 71 years of age or older, or if you are at risk for osteoporosis and fractures, ask your health care provider if you should be screened.  Ask your health care provider whether you should take a calcium or vitamin D supplement to lower your risk for osteoporosis.  Menopause may have certain physical symptoms and risks.  Hormone replacement therapy may reduce some of these symptoms and risks. Talk to your health care provider about whether hormone replacement therapy is right for you. Follow these instructions at home:  Schedule regular health, dental, and eye exams.  Stay current with your immunizations.  Do not use any tobacco products including cigarettes, chewing tobacco, or electronic cigarettes.  If you are pregnant, do not drink alcohol.  If you are breastfeeding, limit how much and how often you drink alcohol.  Limit alcohol intake to no more than 1 drink per day for nonpregnant women. One drink equals 12 ounces of beer, 5 ounces of wine, or 1 ounces of hard liquor.  Do not use street drugs.  Do not share needles.  Ask  your health care provider for help if you need support or information about quitting drugs.  Tell your health care provider if you often feel depressed.  Tell your health care provider if you have ever been abused or do not feel safe at home. This information is not intended to replace advice given to you by your health care provider. Make sure you discuss any questions you have with your health care provider. Document Released: 12/14/2010 Document Revised: 11/06/2015 Document Reviewed: 03/04/2015 Elsevier Interactive Patient Education  2018 [employer].  Carbohydrate Counting for Diabetes Mellitus, Adult Carbohydrate counting is a method for keeping track of how many carbohydrates you eat. Eating carbohydrates naturally increases the amount of sugar (glucose) in the blood. Counting how many carbohydrates you eat helps keep your blood glucose within normal limits, which helps you manage your diabetes (diabetes mellitus). It is important to know how many carbohydrates you can safely have in each meal. This is different for every person. A diet and nutrition specialist (registered dietitian) can help you make a meal plan and calculate how many carbohydrates you should have at each meal and snack. Carbohydrates are found in the following foods:  Grains, such as breads and cereals.  Dried beans and soy products.  Starchy vegetables, such as potatoes, peas, and corn.  Fruit and fruit juices.  Milk and yogurt.  Sweets and snack foods, such as cake, cookies, candy, chips, and soft drinks.  How do I count carbohydrates? There are two ways to count carbohydrates in food. You can use either of the methods or a combination of both. Reading "Nutrition Facts" on packaged food The "Nutrition Facts" list is included on the labels of almost all packaged foods and beverages in the [place] It includes:  The serving size.  Information about nutrients in each serving, including the grams (g) of  carbohydrate per serving.  To use the "Nutrition Facts":  Decide how many servings you will have.  Multiply the number of servings by the number of carbohydrates per serving.  The resulting number is the total amount of carbohydrates that you will be having.  Learning standard serving sizes of other foods When you eat foods containing carbohydrates that are not packaged or do not include "Nutrition Facts" on the label, you need to measure the servings in order to count the amount of carbohydrates:  Measure the foods that you will eat with a food scale or measuring cup, if needed.  Decide how many standard-size servings you will eat.  Multiply the number of servings by 15. Most carbohydrate-rich foods have about 15 g of carbohydrates per serving. ? For example, if you eat 8 oz (170 g) of strawberries, you will have eaten 2 servings and 30 g of carbohydrates (2 servings x 15 g = 30 g).  For foods that have more than one food mixed, such as soups and casseroles, you must count the carbohydrates in each food that is included.  The following list contains standard serving sizes of common carbohydrate-rich foods. Each of these servings has about 15 g of carbohydrates:   hamburger bun or  English muffin.   oz (15 mL) syrup.   oz (14 g) jelly.  1 slice of bread.  1 six-inch tortilla.  3 oz (85 g) cooked rice or pasta.  4 oz (113 g) cooked dried beans.  4 oz (113 g) starchy vegetable, such as peas, corn, or potatoes.  4 oz (113 g) hot cereal.  4 oz (113 g) mashed potatoes or  of a large baked potato.  4 oz (113 g) canned or frozen fruit.  4 oz (120 mL) fruit juice.  4-6 crackers.  6 chicken nuggets.  6 oz (170 g) unsweetened dry cereal.  6 oz (170 g) plain fat-free yogurt or yogurt sweetened with artificial sweeteners.  8 oz (240 mL) milk.  8 oz (170 g) fresh fruit or one small piece of fruit.  24 oz (680 g) popped popcorn.  Example of carbohydrate  counting Sample meal  3 oz (85 g) chicken breast.  6 oz (170 g) brown rice.  4 oz (113 g) corn.  8 oz (240 mL) milk.  8 oz (170 g) strawberries with sugar-free whipped topping. Carbohydrate calculation 1. Identify the foods that contain carbohydrates: ? Rice. ? Corn. ? Milk. ? Strawberries. 2. Calculate how many servings you have of each food: ? 2 servings rice. ? 1 serving corn. ? 1 serving milk. ? 1 serving strawberries. 3. Multiply each number of servings by 15 g: ? 2 servings rice x 15 g = 30 g. ? 1 serving corn x 15 g = 15 g. ? 1 serving milk x 15 g = 15 g. ? 1 serving strawberries x 15 g = 15 g. 4. Add together all of the amounts to find the total grams of carbohydrates eaten: ? 30 g + 15 g + 15 g + 15 g = 75 g of carbohydrates total. This information is not intended to replace advice given to you by your health care provider. Make sure you discuss any questions you have with your health care provider. Document Released: 05/31/2005 Document Revised: 12/19/2015 Document Reviewed: 11/12/2015 Elsevier Interactive Patient Education  [employer].

## 2017-09-07 LAB — URINALYSIS, COMPLETE W/RFL CULTURE
BACTERIA UA: NONE SEEN /HPF
Bilirubin Urine: NEGATIVE
Glucose, UA: NEGATIVE
Hyaline Cast: NONE SEEN /LPF
Ketones, ur: NEGATIVE
NITRITES URINE, INITIAL: NEGATIVE
PH: 5.5 (ref 5.0–8.0)
Protein, ur: NEGATIVE
RBC / HPF: NONE SEEN /HPF (ref 0–2)
SPECIFIC GRAVITY, URINE: 1.017 (ref 1.001–1.03)

## 2017-09-07 LAB — PAP, TP IMAGING W/ HPV RNA, RFLX HPV TYPE 16,18/45: HPV DNA High Risk: NOT DETECTED

## 2017-09-07 LAB — URINE CULTURE
MICRO NUMBER:: 90375881
Result:: NO GROWTH
SAMPLE SOURCE: 0
SPECIMEN QUALITY: ADEQUATE

## 2017-09-07 LAB — CULTURE INDICATED

## 2018-06-05 ENCOUNTER — Encounter: Payer: Self-pay | Admitting: Gynecology

## 2018-06-05 ENCOUNTER — Ambulatory Visit: Payer: Managed Care, Other (non HMO) | Admitting: Gynecology

## 2018-06-05 VITALS — BP 126/80

## 2018-06-05 DIAGNOSIS — N63 Unspecified lump in unspecified breast: Secondary | ICD-10-CM | POA: Diagnosis not present

## 2018-06-05 NOTE — Progress Notes (Signed)
    [facility] 1976-04-26 [id number]        42 y.o.  G1P1 presents having noticed a lump in her left breast several days ago while lying in bed.  Non-painful.  Never noticed before.  Reports 3D mammogram through [facility] in September which was negative.  Past medical history,surgical history, problem list, medications, allergies, family history and social history were all reviewed and documented in the EPIC chart.  Directed ROS with pertinent positives and negatives documented in the history of present illness/assessment and plan.  Exam: Bari MantisKim Alexis assistant Vitals:   06/05/18 1022  BP: 126/80   General appearance:  Normal Both breasts examined lying and sitting.  Right without masses, retractions, discharge, adenopathy.  Left with firm mobile well-defined subcutaneous 2 cm mass periphery of the tail of Spence.  No overlying skin changes.  No other masses, retractions, discharge or adenopathy.  Assessment/Plan:  42 y.o. G1P1 firm mass lateral aspect of left breast approaching axillary region.  Discussed differential to include breast tumor, breast cyst, lipoma, sebaceous cyst or lymph node.  With recent 3D mammogram will hold on diagnostic mammogram for now and schedule an ultrasound to start.  Will allow discretion of radiologist as far as mammogram on that side.  Various scenarios to include observation, needle biopsy and excisional biopsy discussed.    Dara Lordsimothy P Hatsue Sime MD, 10:35 AM 06/05/2018

## 2018-06-05 NOTE — Patient Instructions (Signed)
The breast center will call you to arrange for the ultrasound of the breast.  Call my office if you do not hear from them within 1 to 2 weeks.

## 2018-06-08 ENCOUNTER — Telehealth: Payer: Self-pay | Admitting: *Deleted

## 2018-06-08 DIAGNOSIS — N63 Unspecified lump in unspecified breast: Secondary | ICD-10-CM

## 2018-06-08 NOTE — Telephone Encounter (Signed)
-----   Message from Dara Lordsimothy P Fontaine, MD sent at 06/05/2018 10:37 AM EST ----- Arrange at the breast center ultrasound and possible diagnostic mammography at their discretion reference new onset left tail of Spence mass.  Recent 3D mammogram at [facility] negative in September.

## 2018-06-08 NOTE — Telephone Encounter (Signed)
Appointment on 06/15/2018 @ 3:00pm at breast center, orders placed.  patient informed,

## 2018-06-15 ENCOUNTER — Ambulatory Visit
Admission: RE | Admit: 2018-06-15 | Discharge: 2018-06-15 | Disposition: A | Discharge status: Home / Self Care | Payer: Managed Care, Other (non HMO) | Source: Ambulatory Visit | Attending: Gynecology | Admitting: Gynecology

## 2018-06-15 DIAGNOSIS — N63 Unspecified lump in unspecified breast: Secondary | ICD-10-CM

## 2018-09-08 ENCOUNTER — Other Ambulatory Visit: Payer: Self-pay

## 2018-09-12 ENCOUNTER — Encounter: Payer: Self-pay | Admitting: Women's Health

## 2018-09-12 ENCOUNTER — Ambulatory Visit (INDEPENDENT_AMBULATORY_CARE_PROVIDER_SITE_OTHER): Payer: Managed Care, Other (non HMO) | Admitting: Women's Health

## 2018-09-12 ENCOUNTER — Other Ambulatory Visit: Payer: Self-pay

## 2018-09-12 VITALS — BP 128/70 | Ht 64.5 in | Wt 223.0 lb

## 2018-09-12 DIAGNOSIS — Z01419 Encounter for gynecological examination (general) (routine) without abnormal findings: Secondary | ICD-10-CM | POA: Diagnosis not present

## 2018-09-12 DIAGNOSIS — Z3041 Encounter for surveillance of contraceptive pills: Secondary | ICD-10-CM

## 2018-09-12 MED ORDER — NORETHINDRONE 0.35 MG PO TABS: 1.0000 | ORAL_TABLET | Freq: Every day | ORAL | 4 refills | Status: DC

## 2018-09-12 NOTE — Addendum Note (Signed)
Addended by: Berna Spare A on: 09/12/2018 09:15 AM   Modules accepted: Orders

## 2018-09-12 NOTE — Patient Instructions (Signed)

## 2018-09-12 NOTE — Addendum Note (Signed)
Addended by: Harrington Challenger on: 09/12/2018 08:33 AM   Modules accepted: Orders

## 2018-09-12 NOTE — Progress Notes (Signed)
[facility] 11/23/1975 [id number]    History:    Presents for annual exam.   lo Loestrin continuously with rare cycle.  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  Normal mammogram history.  Primary care managing hypertension and hypercholesteremia.  Not sexually active in years.  Pneumonia vaccine last year, history of asthma.  Past medical history, past surgical history, family history and social history were all reviewed and documented in the EPIC chart.  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  Anne Vazquez 11.  ROS:  A ROS was performed and pertinent positives and negatives are included.  Exam:  Vitals:   09/12/18 0803  BP: 128/70  Weight: 223 lb (101.2 kg)  Height: 5' 4.5" (1.638 m)   Body mass index is 37.69 kg/m.   General appearance:  Normal Thyroid:  Symmetrical, normal in size, without palpable masses or nodularity. Respiratory  Auscultation:  Clear without wheezing or rhonchi Cardiovascular  Auscultation:  Regular rate, without rubs, murmurs or gallops  Edema/varicosities:  Not grossly evident Abdominal  Soft,nontender, without masses, guarding or rebound.  Liver/spleen:  No organomegaly noted  Hernia:  None appreciated  Skin  Inspection:  Grossly normal   Breasts: Examined lying and sitting.     Right: Without masses, retractions, discharge or axillary adenopathy.     Left: Without masses, retractions, discharge or axillary adenopathy. Gentitourinary   Inguinal/mons:  Normal without inguinal adenopathy  External genitalia:  Normal  BUS/Urethra/Skene's glands:  Normal  Vagina:  Normal  Cervix:  Normal friable  Uterus:  normal in size, shape and contour.  Midline and mobile  Adnexa/parametria:     Rt: Without masses or tenderness.   Lt: Without masses or tenderness.  Anus and perineum: Normal  Digital rectal exam: Normal sphincter tone without palpated masses or  tenderness  Assessment/Plan:  43 y.o. D WF G1, P1 for annual exam with no complaints.  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  Reviewed less risk for blood clots and strokes with a progestin only pill.  Condoms encouraged if sexually active.  SBEs, continue annual screening mammogram, calcium rich foods, vitamin D 2000 daily, reviewed importance of increasing exercise and decreasing calorie/carbs.  Encouraged 30-minute brisk walk daily.  Pap,  (Pap normal with negative HR HPV 2019)    Anne Vazquez [facility], 8:12 AM 09/12/2018

## 2018-09-13 LAB — PAP IG W/ RFLX HPV ASCU

## 2018-11-08 ENCOUNTER — Other Ambulatory Visit: Payer: Self-pay | Admitting: Women's Health

## 2018-11-08 DIAGNOSIS — Z3041 Encounter for surveillance of contraceptive pills: Secondary | ICD-10-CM

## 2018-12-12 ENCOUNTER — Encounter: Payer: Self-pay | Admitting: Women's Health

## 2019-01-09 ENCOUNTER — Other Ambulatory Visit: Payer: Self-pay | Admitting: Gynecology

## 2019-01-09 DIAGNOSIS — Z1231 Encounter for screening mammogram for malignant neoplasm of breast: Secondary | ICD-10-CM

## 2019-03-06 ENCOUNTER — Encounter: Payer: Self-pay | Admitting: Gynecology

## 2019-03-07 ENCOUNTER — Other Ambulatory Visit: Payer: Self-pay

## 2019-03-07 ENCOUNTER — Ambulatory Visit
Admission: RE | Admit: 2019-03-07 | Discharge: 2019-03-07 | Disposition: A | Discharge status: Home / Self Care | Payer: Managed Care, Other (non HMO) | Source: Ambulatory Visit | Attending: Gynecology | Admitting: Gynecology

## 2019-03-07 DIAGNOSIS — Z1231 Encounter for screening mammogram for malignant neoplasm of breast: Secondary | ICD-10-CM

## 2019-09-18 ENCOUNTER — Encounter: Payer: Managed Care, Other (non HMO) | Admitting: Women's Health

## 2019-10-01 ENCOUNTER — Other Ambulatory Visit: Payer: Self-pay

## 2019-10-02 ENCOUNTER — Encounter: Payer: Self-pay | Admitting: Women's Health

## 2019-10-02 ENCOUNTER — Ambulatory Visit (INDEPENDENT_AMBULATORY_CARE_PROVIDER_SITE_OTHER): Payer: Managed Care, Other (non HMO) | Admitting: Women's Health

## 2019-10-02 VITALS — BP 130/80 | Ht 64.0 in | Wt 231.0 lb

## 2019-10-02 DIAGNOSIS — Z3041 Encounter for surveillance of contraceptive pills: Secondary | ICD-10-CM | POA: Diagnosis not present

## 2019-10-02 DIAGNOSIS — Z01419 Encounter for gynecological examination (general) (routine) without abnormal findings: Secondary | ICD-10-CM | POA: Diagnosis not present

## 2019-10-02 MED ORDER — NORETHINDRONE 0.35 MG PO TABS: 1.0000 | ORAL_TABLET | Freq: Every day | ORAL | 4 refills | Status: DC

## 2019-10-02 NOTE — Progress Notes (Signed)
Anne Vazquez [facility] [birth year] [id number]    History:    Presents for annual exam.  Rare bleeding on Micronor.  Not sexually active in years.  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  Primary care manages hypertension, hypercholesteremia and asthma.  Has had the Covid vaccine.  Past medical history, past surgical history, family history and social history were all reviewed and documented in the EPIC chart.  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  Anne Vazquez 12 doing okay at school.  ROS:  A ROS was performed and pertinent positives and negatives are included.  Exam:  Vitals:   10/02/19 0801  BP: 130/80  Weight: 231 lb (104.8 kg)  Height: 5\' 4"  (1.626 m)   Body mass index is 39.65 kg/m.   General appearance:  Normal Thyroid:  Symmetrical, normal in size, without palpable masses or nodularity. Respiratory  Auscultation:  Clear without wheezing or rhonchi Cardiovascular  Auscultation:  Regular rate, without rubs, murmurs or gallops  Edema/varicosities:  Not grossly evident Abdominal  Soft,nontender, without masses, guarding or rebound.  Liver/spleen:  No organomegaly noted  Hernia:  None appreciated  Skin  Inspection:  Grossly normal   Breasts: Examined lying and sitting.     Right: Without masses, retractions, discharge or axillary adenopathy.     Left: Without masses, retractions, discharge or axillary adenopathy. Gentitourinary   Inguinal/mons:  Normal without inguinal adenopathy  External genitalia:  Normal  BUS/Urethra/Skene's glands:  Normal  Vagina:  Normal  Cervix:  Normal  Uterus:  normal in size, shape and contour.  Midline and mobile  Adnexa/parametria:     Rt: Without masses or tenderness.   Lt: Without masses or tenderness.  Anus and perineum: Normal  Digital rectal exam: Normal sphincter tone without palpated masses or tenderness  Assessment/Plan:  44 y.o. 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  SBEs, continue annual screening mammogram, calcium rich foods, vitamin D 1000 IUs daily.  Aware of importance of increasing exercise and decreasing calories/carbs.  Weight watchers encouraged.  Pap.    2012 [facility], 8:28 AM 10/02/2019

## 2019-10-02 NOTE — Patient Instructions (Signed)
Vit 1000 iu daily Pleasure knowing you! Health Maintenance, Female Adopting a healthy lifestyle and getting preventive care are important in promoting health and wellness. Ask your health care provider about:  The right schedule for you to have regular tests and exams.  Things you can do on your own to prevent diseases and keep yourself healthy. What should I know about diet, weight, and exercise? Eat a healthy diet   Eat a diet that includes plenty of vegetables, fruits, low-fat dairy products, and lean protein.  Do not eat a lot of foods that are high in solid fats, added sugars, or sodium. Maintain a healthy weight Body mass index (BMI) is used to identify weight problems. It estimates body fat based on height and weight. Your health care provider can help determine your BMI and help you achieve or maintain a healthy weight. Get regular exercise Get regular exercise. This is one of the most important things you can do for your health. Most adults should:  Exercise for at least 150 minutes each week. The exercise should increase your heart rate and make you sweat (moderate-intensity exercise).  Do strengthening exercises at least twice a week. This is in addition to the moderate-intensity exercise.  Spend less time sitting. Even light physical activity can be beneficial. Watch cholesterol and blood lipids Have your blood tested for lipids and cholesterol at 46 years of age, then have this test every 5 years. Have your cholesterol levels checked more often if:  Your lipid or cholesterol levels are high.  You are older than 46 years of age.  You are at high risk for heart disease. What should I know about cancer screening? Depending on your health history and family history, you may need to have cancer screening at various ages. This may include screening for:  Breast cancer.  Cervical cancer.  Colorectal cancer.  Skin cancer.  Lung cancer. What should I know about heart  disease, diabetes, and high blood pressure? Blood pressure and heart disease  High blood pressure causes heart disease and increases the risk of stroke. This is more likely to develop in people who have high blood pressure readings, are of African descent, or are overweight.  Have your blood pressure checked: ? Every 3-5 years if you are 46-6 years of age. ? Every year if you are 46 years old or older. Diabetes Have regular diabetes screenings. This checks your fasting blood sugar level. Have the screening done:  Once every three years after age 46 if you are at a normal weight and have a low risk for diabetes.  More often and at a younger age if you are overweight or have a high risk for diabetes. What should I know about preventing infection? Hepatitis B If you have a higher risk for hepatitis B, you should be screened for this virus. Talk with your health care provider to find out if you are at risk for hepatitis B infection. Hepatitis C Testing is recommended for:  Everyone born from [birth year] through 1965.  Anyone with known risk factors for hepatitis C. Sexually transmitted infections (STIs)  Get screened for STIs, including gonorrhea and chlamydia, if: ? You are sexually active and are younger than 46 years of age. ? You are older than 46 years of age and your health care provider tells you that you are at risk for this type of infection. ? Your sexual activity has changed since you were last screened, and you are at increased risk for chlamydia or  gonorrhea. Ask your health care provider if you are at risk.  Ask your health care provider about whether you are at high risk for HIV. Your health care provider may recommend a prescription medicine to help prevent HIV infection. If you choose to take medicine to prevent HIV, you should first get tested for HIV. You should then be tested every 3 months for as long as you are taking the medicine. Pregnancy  If you are about to stop  having your period (premenopausal) and you may become pregnant, seek counseling before you get pregnant.  Take 400 to 800 micrograms (mcg) of folic acid every day if you become pregnant.  Ask for birth control (contraception) if you want to prevent pregnancy. Osteoporosis and menopause Osteoporosis is a disease in which the bones lose minerals and strength with aging. This can result in bone fractures. If you are 9 years old or older, or if you are at risk for osteoporosis and fractures, ask your health care provider if you should:  Be screened for bone loss.  Take a calcium or vitamin D supplement to lower your risk of fractures.  Be given hormone replacement therapy (HRT) to treat symptoms of menopause. Follow these instructions at home: Lifestyle  Do not use any products that contain nicotine or tobacco, such as cigarettes, e-cigarettes, and chewing tobacco. If you need help quitting, ask your health care provider.  Do not use street drugs.  Do not share needles.  Ask your health care provider for help if you need support or information about quitting drugs. Alcohol use  Do not drink alcohol if: ? Your health care provider tells you not to drink. ? You are pregnant, may be pregnant, or are planning to become pregnant.  If you drink alcohol: ? Limit how much you use to 0-1 drink a day. ? Limit intake if you are breastfeeding.  Be aware of how much alcohol is in your drink. In the [place] one drink equals one 12 oz bottle of beer (355 mL), one 5 oz glass of wine (148 mL), or one 1 oz glass of hard liquor (44 mL). General instructions  Schedule regular health, dental, and eye exams.  Stay current with your vaccines.  Tell your health care provider if: ? You often feel depressed. ? You have ever been abused or do not feel safe at home. Summary  Adopting a healthy lifestyle and getting preventive care are important in promoting health and wellness.  Follow your health  care provider's instructions about healthy diet, exercising, and getting tested or screened for diseases.  Follow your health care provider's instructions on monitoring your cholesterol and blood pressure. This information is not intended to replace advice given to you by your health care provider. Make sure you discuss any questions you have with your health care provider. Document Revised: 05/24/2018 Document Reviewed: 05/24/2018 Elsevier Patient Education  2020 [employer].

## 2019-10-02 NOTE — Addendum Note (Signed)
Addended by: Tito Dine on: 10/02/2019 08:44 AM   Modules accepted: Orders

## 2019-10-04 LAB — URINALYSIS, COMPLETE W/RFL CULTURE
Bacteria, UA: NONE SEEN /HPF
Bilirubin Urine: NEGATIVE
Glucose, UA: NEGATIVE
Hgb urine dipstick: NEGATIVE
Hyaline Cast: NONE SEEN /LPF
Ketones, ur: NEGATIVE
Nitrites, Initial: NEGATIVE
Protein, ur: NEGATIVE
RBC / HPF: NONE SEEN /HPF (ref 0–2)
Specific Gravity, Urine: 1.023 (ref 1.001–1.03)
pH: 6 (ref 5.0–8.0)

## 2019-10-04 LAB — URINE CULTURE
MICRO NUMBER:: 10386455
Result:: NO GROWTH
SPECIMEN QUALITY:: ADEQUATE

## 2019-10-04 LAB — PAP IG W/ RFLX HPV ASCU

## 2019-10-04 LAB — CULTURE INDICATED

## 2020-03-14 ENCOUNTER — Other Ambulatory Visit: Payer: Self-pay | Admitting: Nurse Practitioner

## 2020-03-14 DIAGNOSIS — Z1231 Encounter for screening mammogram for malignant neoplasm of breast: Secondary | ICD-10-CM

## 2020-04-03 ENCOUNTER — Ambulatory Visit
Admission: RE | Admit: 2020-04-03 | Discharge: 2020-04-03 | Disposition: A | Discharge status: Home / Self Care | Payer: Managed Care, Other (non HMO) | Source: Ambulatory Visit | Attending: Nurse Practitioner | Admitting: Nurse Practitioner

## 2020-04-03 ENCOUNTER — Other Ambulatory Visit: Payer: Self-pay

## 2020-04-03 ENCOUNTER — Ambulatory Visit: Payer: Managed Care, Other (non HMO)

## 2020-04-03 DIAGNOSIS — Z1231 Encounter for screening mammogram for malignant neoplasm of breast: Secondary | ICD-10-CM

## 2020-10-07 ENCOUNTER — Encounter: Payer: Managed Care, Other (non HMO) | Admitting: Nurse Practitioner

## 2020-10-14 ENCOUNTER — Ambulatory Visit (INDEPENDENT_AMBULATORY_CARE_PROVIDER_SITE_OTHER): Payer: Managed Care, Other (non HMO) | Admitting: Nurse Practitioner

## 2020-10-14 ENCOUNTER — Encounter: Payer: Self-pay | Admitting: Nurse Practitioner

## 2020-10-14 ENCOUNTER — Other Ambulatory Visit: Payer: Self-pay

## 2020-10-14 VITALS — BP 140/70 | Ht 65.0 in | Wt 232.0 lb

## 2020-10-14 DIAGNOSIS — Z3041 Encounter for surveillance of contraceptive pills: Secondary | ICD-10-CM | POA: Diagnosis not present

## 2020-10-14 DIAGNOSIS — Z01419 Encounter for gynecological examination (general) (routine) without abnormal findings: Secondary | ICD-10-CM

## 2020-10-14 MED ORDER — NORETHINDRONE 0.35 MG PO TABS: 1.0000 | ORAL_TABLET | Freq: Every day | ORAL | 3 refills | Status: AC

## 2020-10-14 NOTE — Progress Notes (Signed)
   Anne Vazquez [facility] July 23, 1975 [id number]   History:  45 y.o. G1P0001 presents for annual exam without GYN complaints. Cycles every 1-3 months on Micronor. 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 Normal mammogram history.   Gynecologic History Patient's last menstrual period was 08/25/2020. Period Cycle (Days):  (Every 2-3 months) Period Duration (Days): 5 Period Pattern: (!) Irregular Menstrual Flow: Moderate Dysmenorrhea: (!) Moderate Dysmenorrhea Symptoms: Cramping Contraception/Family planning: oral progesterone-only contraceptive  Health Maintenance Last Pap: 10/02/2019. Results were: normal Last mammogram: 04/03/2020. Results were: normal Last colonoscopy: Never Last Dexa: N/A  Past medical history, past surgical history, family history and social history were all reviewed and documented in the EPIC chart. 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   ROS:  A ROS was performed and pertinent positives and negatives are included.  Exam:  Vitals:   10/14/20 0802  BP: 140/70  Weight: 232 lb (105.2 kg)  Height: 5\' 5"  (1.651 m)   Body mass index is 38.61 kg/m.  General appearance:  Normal Thyroid:  Symmetrical, normal in size, without palpable masses or nodularity. Respiratory  Auscultation:  Clear without wheezing or rhonchi Cardiovascular  Auscultation:  Regular rate, without rubs, murmurs or gallops  Edema/varicosities:  Not grossly evident Abdominal  Soft,nontender, without masses, guarding or rebound.  Liver/spleen:  No organomegaly noted  Hernia:  None appreciated  Skin  Inspection:  Grossly normal Breasts: Examined lying and sitting.   Right: Without masses, retractions, nipple discharge or axillary adenopathy.   Left: Without masses, retractions, nipple discharge or axillary adenopathy. Gentitourinary   Inguinal/mons:  Normal without inguinal adenopathy  External genitalia:  Normal appearing vulva with no masses, tenderness, or  lesions  BUS/Urethra/Skene's glands:  Normal  Vagina:  Normal appearing with normal color and discharge, no lesions  Cervix:  Normal appearing without discharge or lesions  Uterus:  Normal in size, shape and contour.  Midline and mobile, nontender  Adnexa/parametria:     Rt: Normal in size, without masses or tenderness.   Lt: Normal in size, without masses or tenderness.  Anus and perineum: Normal  Digital rectal exam: Normal sphincter tone without palpated masses or tenderness  Assessment/Plan:  45 y.o. G1P0001 for annual exam.   Well female exam with routine gynecological exam - Education provided on SBEs, importance of preventative screenings, current guidelines, high calcium diet, regular exercise, and multivitamin daily. Labs with PCP.   Encounter for surveillance of contraceptive pills - Plan: norethindrone (ORTHO MICRONOR) 0.35 MG tablet daily. Taking as prescribed. Refill x 1 year provided.   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 Will repeat at 3-year interval per guidelines.  Screening for breast cancer - Normal mammogram history.  Continue annual screenings.  Normal breast exam today.  Return in 1 year for annual.    2012 DNP, 8:16 AM 10/14/2020

## 2020-10-14 NOTE — Patient Instructions (Addendum)
Schedule colonoscopy! [facility] [phone] [address] [place], [place] [address]   Health Maintenance, Female Adopting a healthy lifestyle and getting preventive care are important in promoting health and wellness. Ask your health care provider about:  The right schedule for you to have regular tests and exams.  Things you can do on your own to prevent diseases and keep yourself healthy. What should I know about diet, weight, and exercise? Eat a healthy diet  Eat a diet that includes plenty of vegetables, fruits, low-fat dairy products, and lean protein.  Do not eat a lot of foods that are high in solid fats, added sugars, or sodium.   Maintain a healthy weight Body mass index (BMI) is used to identify weight problems. It estimates body fat based on height and weight. Your health care provider can help determine your BMI and help you achieve or maintain a healthy weight. Get regular exercise Get regular exercise. This is one of the most important things you can do for your health. Most adults should:  Exercise for at least 150 minutes each week. The exercise should increase your heart rate and make you sweat (moderate-intensity exercise).  Do strengthening exercises at least twice a week. This is in addition to the moderate-intensity exercise.  Spend less time sitting. Even light physical activity can be beneficial. Watch cholesterol and blood lipids Have your blood tested for lipids and cholesterol at 47 years of age, then have this test every 5 years. Have your cholesterol levels checked more often if:  Your lipid or cholesterol levels are high.  You are older than 47 years of age.  You are at high risk for heart disease. What should I know about cancer screening? Depending on your health history and family history, you may need to have cancer screening at various ages. This may include screening for:  Breast cancer.  Cervical cancer.  Colorectal cancer.  Skin  cancer.  Lung cancer. What should I know about heart disease, diabetes, and high blood pressure? Blood pressure and heart disease  High blood pressure causes heart disease and increases the risk of stroke. This is more likely to develop in people who have high blood pressure readings, are of African descent, or are overweight.  Have your blood pressure checked: ? Every 3-5 years if you are 18-39 years of age. ? Every year if you are 40 years old or older. Diabetes Have regular diabetes screenings. This checks your fasting blood sugar level. Have the screening done:  Once every three years after age 40 if you are at a normal weight and have a low risk for diabetes.  More often and at a younger age if you are overweight or have a high risk for diabetes. What should I know about preventing infection? Hepatitis B If you have a higher risk for hepatitis B, you should be screened for this virus. Talk with your health care provider to find out if you are at risk for hepatitis B infection. Hepatitis C Testing is recommended for:  Everyone born from 1945 through 1965.  Anyone with known risk factors for hepatitis C. Sexually transmitted infections (STIs)  Get screened for STIs, including gonorrhea and chlamydia, if: ? You are sexually active and are younger than 47 years of age. ? You are older than 47 years of age and your health care provider tells you that you are at risk for this type of infection. ? Your sexual activity has changed since you were last screened,   and you are at increased risk for chlamydia or gonorrhea. Ask your health care provider if you are at risk.  Ask your health care provider about whether you are at high risk for HIV. Your health care provider may recommend a prescription medicine to help prevent HIV infection. If you choose to take medicine to prevent HIV, you should first get tested for HIV. You should then be tested every 3 months for as long as you are taking  the medicine. Pregnancy  If you are about to stop having your period (premenopausal) and you may become pregnant, seek counseling before you get pregnant.  Take 400 to 800 micrograms (mcg) of folic acid every day if you become pregnant.  Ask for birth control (contraception) if you want to prevent pregnancy. Osteoporosis and menopause Osteoporosis is a disease in which the bones lose minerals and strength with aging. This can result in bone fractures. If you are 65 years old or older, or if you are at risk for osteoporosis and fractures, ask your health care provider if you should:  Be screened for bone loss.  Take a calcium or vitamin D supplement to lower your risk of fractures.  Be given hormone replacement therapy (HRT) to treat symptoms of menopause. Follow these instructions at home: Lifestyle  Do not use any products that contain nicotine or tobacco, such as cigarettes, e-cigarettes, and chewing tobacco. If you need help quitting, ask your health care provider.  Do not use street drugs.  Do not share needles.  Ask your health care provider for help if you need support or information about quitting drugs. Alcohol use  Do not drink alcohol if: ? Your health care provider tells you not to drink. ? You are pregnant, may be pregnant, or are planning to become pregnant.  If you drink alcohol: ? Limit how much you use to 0-1 drink a day. ? Limit intake if you are breastfeeding.  Be aware of how much alcohol is in your drink. In the [place] one drink equals one 12 oz bottle of beer (355 mL), one 5 oz glass of wine (148 mL), or one 1 oz glass of hard liquor (44 mL). General instructions  Schedule regular health, dental, and eye exams.  Stay current with your vaccines.  Tell your health care provider if: ? You often feel depressed. ? You have ever been abused or do not feel safe at home. Summary  Adopting a healthy lifestyle and getting preventive care are important in  promoting health and wellness.  Follow your health care provider's instructions about healthy diet, exercising, and getting tested or screened for diseases.  Follow your health care provider's instructions on monitoring your cholesterol and blood pressure. This information is not intended to replace advice given to you by your health care provider. Make sure you discuss any questions you have with your health care provider. Document Revised: 05/24/2018 Document Reviewed: 05/24/2018 Elsevier Patient Education  2021 Elsevier Inc.  

## 2021-02-23 ENCOUNTER — Other Ambulatory Visit: Payer: Self-pay | Admitting: Nurse Practitioner

## 2021-02-23 DIAGNOSIS — Z1231 Encounter for screening mammogram for malignant neoplasm of breast: Secondary | ICD-10-CM

## 2021-04-08 ENCOUNTER — Other Ambulatory Visit: Payer: Self-pay

## 2021-04-08 ENCOUNTER — Ambulatory Visit
Admission: RE | Admit: 2021-04-08 | Discharge: 2021-04-08 | Disposition: A | Discharge status: Home / Self Care | Payer: Managed Care, Other (non HMO) | Source: Ambulatory Visit

## 2021-04-08 DIAGNOSIS — Z1231 Encounter for screening mammogram for malignant neoplasm of breast: Secondary | ICD-10-CM

## 2021-04-13 ENCOUNTER — Other Ambulatory Visit: Payer: Self-pay | Admitting: Nurse Practitioner

## 2021-04-13 DIAGNOSIS — R928 Other abnormal and inconclusive findings on diagnostic imaging of breast: Secondary | ICD-10-CM

## 2021-04-16 ENCOUNTER — Other Ambulatory Visit: Payer: Self-pay

## 2021-04-16 ENCOUNTER — Ambulatory Visit
Admission: RE | Admit: 2021-04-16 | Discharge: 2021-04-16 | Disposition: A | Discharge status: Home / Self Care | Payer: Managed Care, Other (non HMO) | Source: Ambulatory Visit | Attending: Nurse Practitioner | Admitting: Nurse Practitioner

## 2021-04-16 ENCOUNTER — Ambulatory Visit: Payer: Managed Care, Other (non HMO)

## 2021-04-16 DIAGNOSIS — R928 Other abnormal and inconclusive findings on diagnostic imaging of breast: Secondary | ICD-10-CM

## 2021-04-18 ENCOUNTER — Other Ambulatory Visit: Payer: Managed Care, Other (non HMO)

## 2021-10-15 ENCOUNTER — Ambulatory Visit: Payer: Managed Care, Other (non HMO) | Admitting: Nurse Practitioner

## 2021-11-02 ENCOUNTER — Other Ambulatory Visit: Payer: Self-pay | Admitting: Nurse Practitioner

## 2021-11-02 DIAGNOSIS — Z3041 Encounter for surveillance of contraceptive pills: Secondary | ICD-10-CM

## 2022-03-16 ENCOUNTER — Other Ambulatory Visit: Payer: Self-pay | Admitting: Nurse Practitioner

## 2022-03-16 DIAGNOSIS — Z1231 Encounter for screening mammogram for malignant neoplasm of breast: Secondary | ICD-10-CM

## 2022-04-22 ENCOUNTER — Ambulatory Visit: Payer: Managed Care, Other (non HMO)

## 2022-05-21 ENCOUNTER — Ambulatory Visit: Payer: Managed Care, Other (non HMO)

## 2022-05-25 IMAGING — MG MM DIGITAL DIAGNOSTIC UNILAT*R* W/ TOMO W/ CAD
4 series · 4 of 12 positions shown · non-contrast
Comparison: Previous exam(s).

CLINICAL DATA: The patient was called back for a right breast
asymmetry.

EXAM:
DIGITAL DIAGNOSTIC UNILATERAL RIGHT MAMMOGRAM WITH TOMOSYNTHESIS AND
CAD
TECHNIQUE: Right digital diagnostic mammography and breast tomosynthesis was
performed. The images were evaluated with computer-aided detection.

[R ML synth-2D]
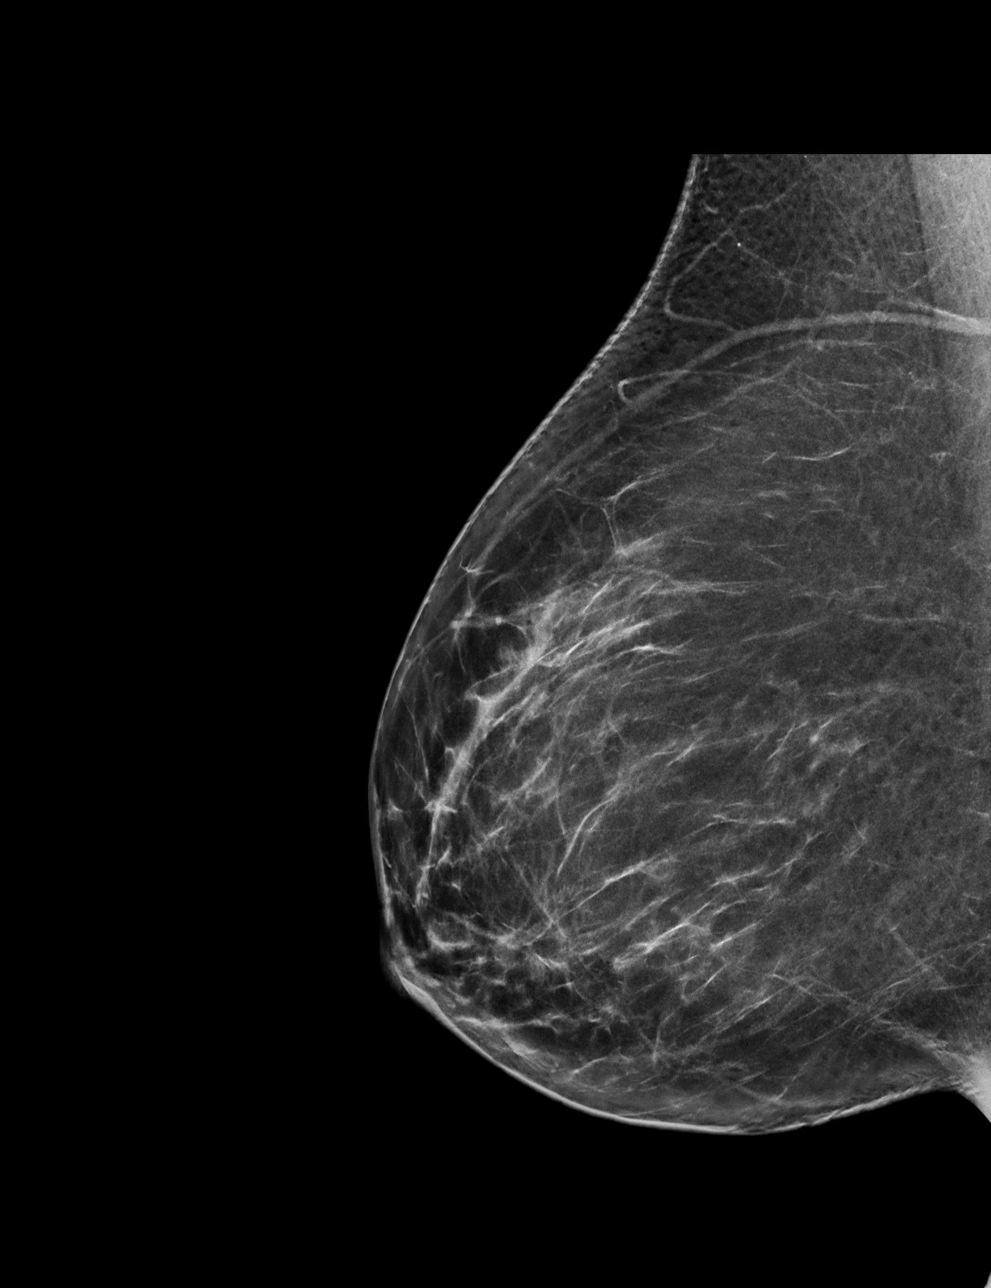

[R MLO synth-2D]
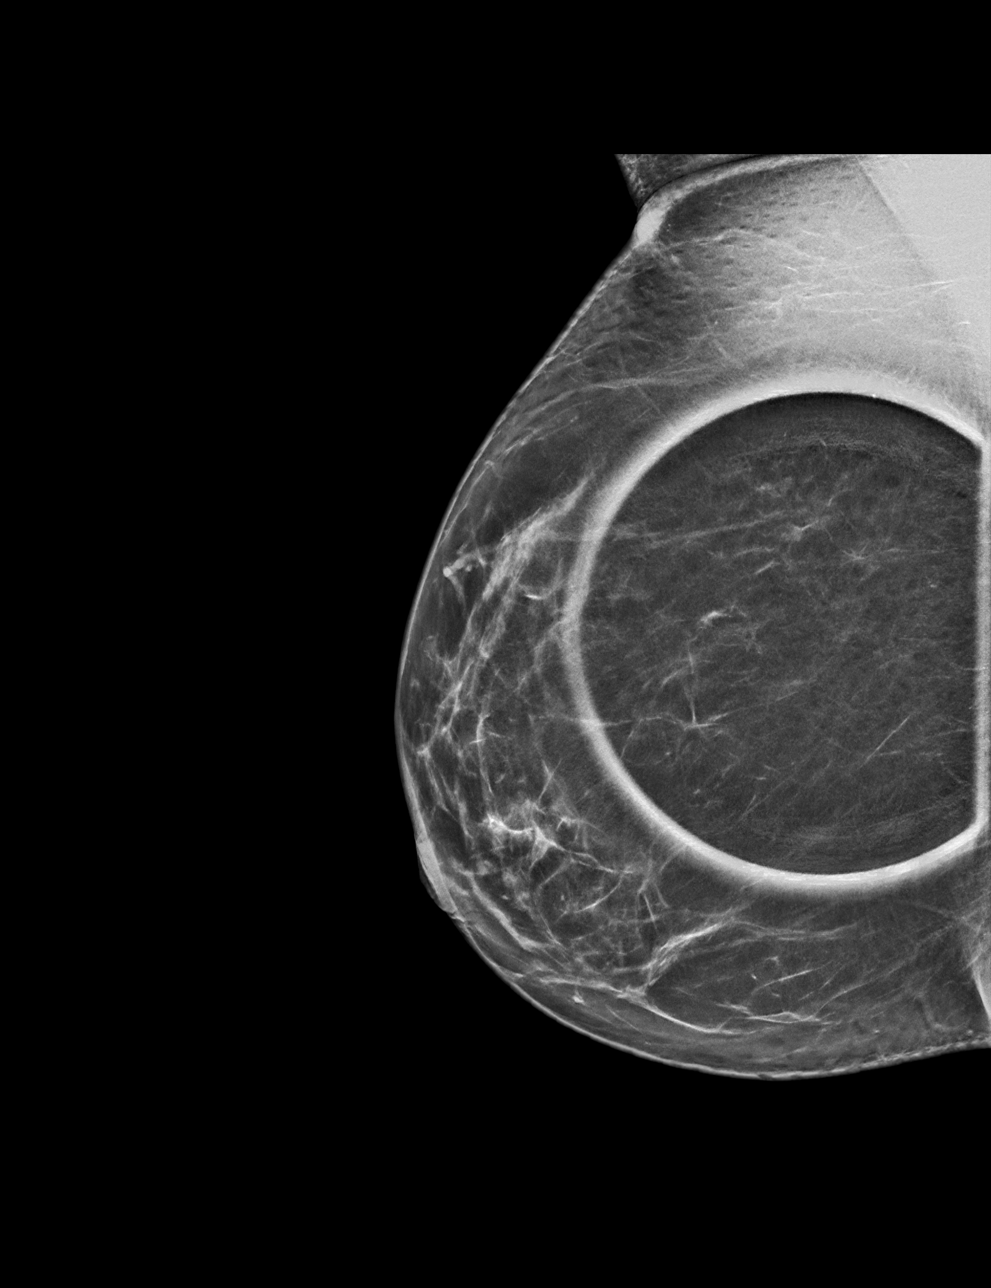

[R MLO tomo · tomo slice 35/68.0]
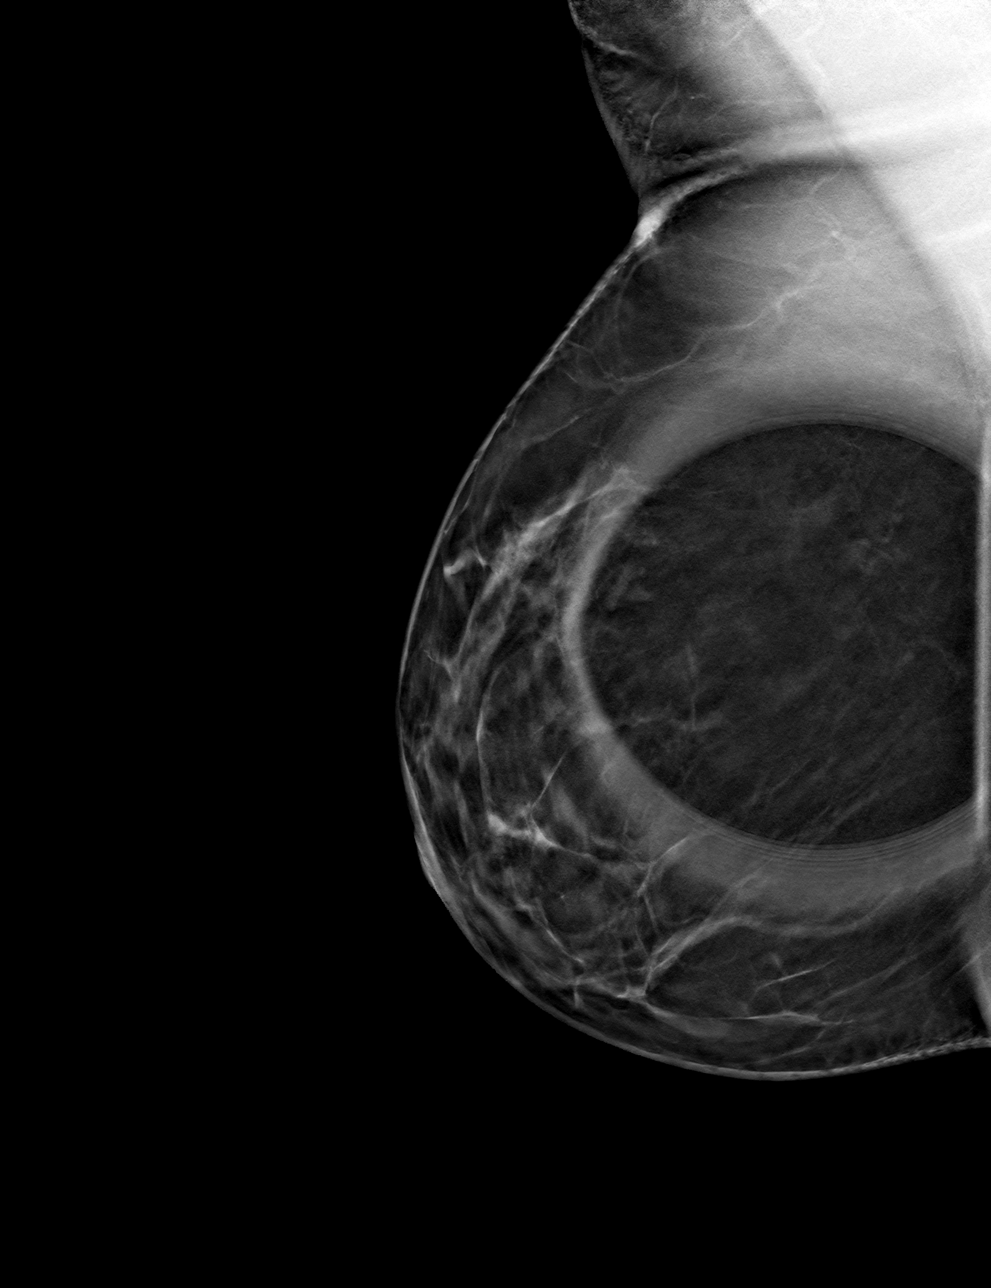

[R ML tomo · tomo slice 35/69.0]
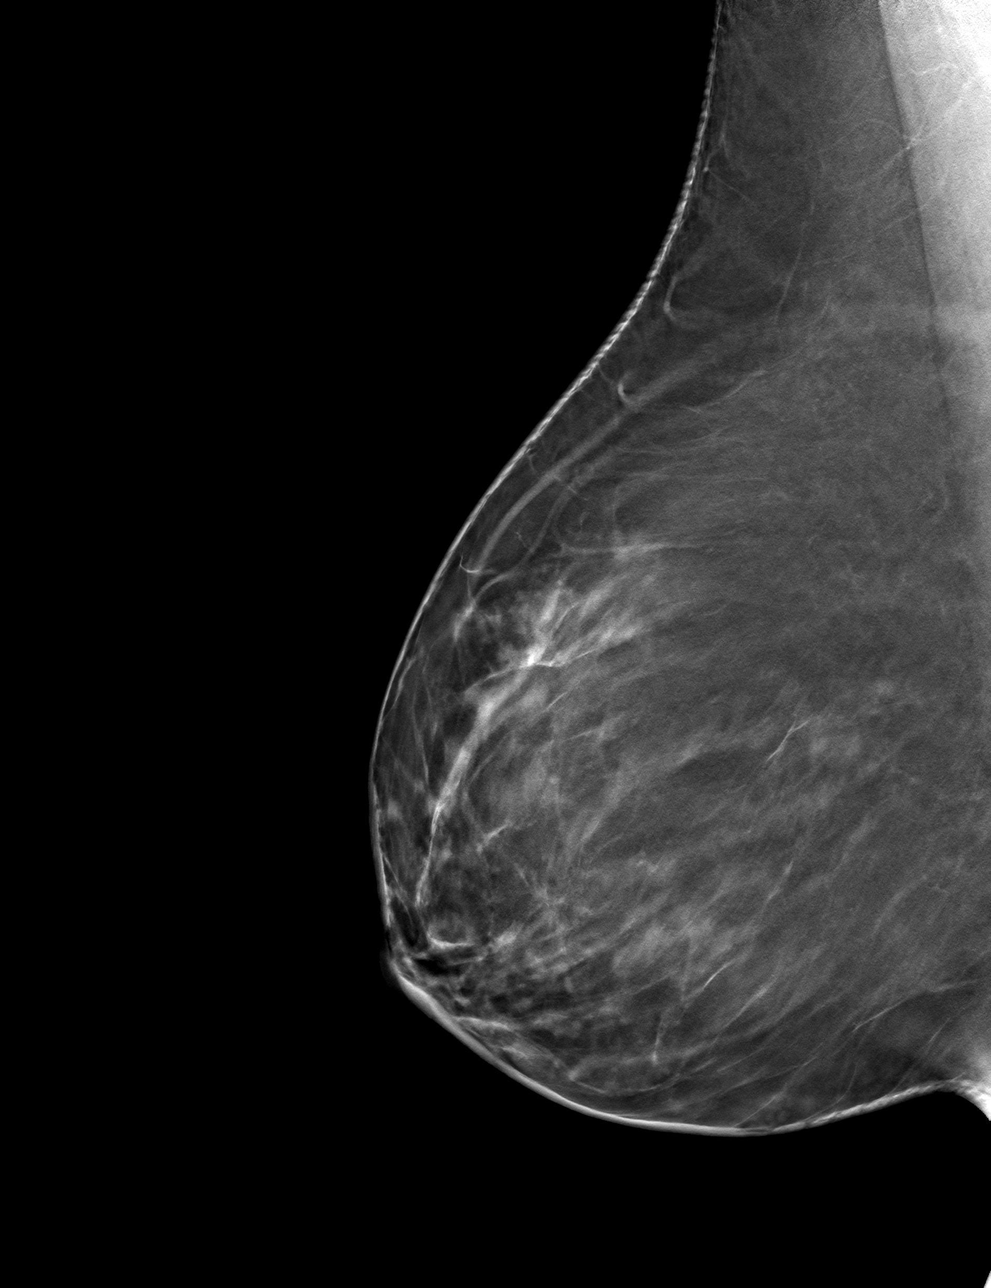

[4 of 12 positions shown; findings below may reference images not displayed]

ACR Breast Density Category b: There are scattered areas of
fibroglandular density.
FINDINGS: The right breast asymmetry resolves on today's imaging.
IMPRESSION: No mammographic evidence of malignancy.

RECOMMENDATION:
Annual screening mammography.

I have discussed the findings and recommendations with the patient.
If applicable, a reminder letter will be sent to the patient
regarding the next appointment.

BI-RADS CATEGORY  1: Negative.
